# Patient Record
Sex: Female | Born: 1950
Health system: Southern US, Community
[De-identification: ages and names within clinical notes are randomized; demographics above are authoritative.]

## PROBLEM LIST (undated history)

## (undated) ENCOUNTER — Emergency Department (HOSPITAL_COMMUNITY): Payer: Medicare PPO

## (undated) DIAGNOSIS — D259 Leiomyoma of uterus, unspecified: Secondary | ICD-10-CM

## (undated) DIAGNOSIS — F329 Major depressive disorder, single episode, unspecified: Secondary | ICD-10-CM

## (undated) DIAGNOSIS — J989 Respiratory disorder, unspecified: Secondary | ICD-10-CM

## (undated) DIAGNOSIS — F32A Depression, unspecified: Secondary | ICD-10-CM

## (undated) DIAGNOSIS — M069 Rheumatoid arthritis, unspecified: Secondary | ICD-10-CM

## (undated) DIAGNOSIS — N840 Polyp of corpus uteri: Secondary | ICD-10-CM

## (undated) DIAGNOSIS — C50919 Malignant neoplasm of unspecified site of unspecified female breast: Secondary | ICD-10-CM

## (undated) DIAGNOSIS — N950 Postmenopausal bleeding: Secondary | ICD-10-CM

## (undated) DIAGNOSIS — D649 Anemia, unspecified: Secondary | ICD-10-CM

## (undated) DIAGNOSIS — K219 Gastro-esophageal reflux disease without esophagitis: Secondary | ICD-10-CM

## (undated) DIAGNOSIS — F419 Anxiety disorder, unspecified: Secondary | ICD-10-CM

## (undated) DIAGNOSIS — M62838 Other muscle spasm: Secondary | ICD-10-CM

## (undated) DIAGNOSIS — D869 Sarcoidosis, unspecified: Secondary | ICD-10-CM

## (undated) DIAGNOSIS — I10 Essential (primary) hypertension: Secondary | ICD-10-CM

## (undated) DIAGNOSIS — E119 Type 2 diabetes mellitus without complications: Secondary | ICD-10-CM

## (undated) HISTORY — DX: Major depressive disorder, single episode, unspecified: F32.9

## (undated) HISTORY — DX: Anxiety disorder, unspecified: F41.9

## (undated) HISTORY — DX: Depression, unspecified: F32.A

## (undated) HISTORY — DX: Gastro-esophageal reflux disease without esophagitis: K21.9

## (undated) HISTORY — PX: FOOT SURGERY: SHX648

## (undated) HISTORY — PX: LYMPH NODE BIOPSY: SHX201

## (undated) HISTORY — PX: COLONOSCOPY: SHX174

## (undated) HISTORY — PX: MOUTH SURGERY: SHX715

---

## 2000-04-07 ENCOUNTER — Other Ambulatory Visit: Admission: RE | Admit: 2000-04-07 | Discharge: 2000-04-07 | Payer: Self-pay | Admitting: Gynecology

## 2001-05-25 ENCOUNTER — Other Ambulatory Visit: Admission: RE | Admit: 2001-05-25 | Discharge: 2001-05-25 | Payer: Self-pay | Admitting: *Deleted

## 2001-06-01 ENCOUNTER — Encounter: Payer: Self-pay | Admitting: *Deleted

## 2001-06-01 ENCOUNTER — Encounter: Admission: RE | Admit: 2001-06-01 | Discharge: 2001-06-01 | Payer: Self-pay | Admitting: *Deleted

## 2001-07-04 ENCOUNTER — Other Ambulatory Visit: Admission: RE | Admit: 2001-07-04 | Discharge: 2001-07-04 | Payer: Self-pay | Admitting: *Deleted

## 2003-10-08 ENCOUNTER — Inpatient Hospital Stay (HOSPITAL_COMMUNITY): Admission: AD | Admit: 2003-10-08 | Discharge: 2003-10-10 | Payer: Self-pay | Admitting: Internal Medicine

## 2004-10-21 ENCOUNTER — Ambulatory Visit: Payer: Self-pay | Admitting: Critical Care Medicine

## 2004-10-22 ENCOUNTER — Ambulatory Visit: Payer: Self-pay | Admitting: Internal Medicine

## 2004-10-29 ENCOUNTER — Ambulatory Visit: Admission: RE | Admit: 2004-10-29 | Discharge: 2004-10-29 | Payer: Self-pay | Admitting: Critical Care Medicine

## 2004-11-18 ENCOUNTER — Ambulatory Visit: Payer: Self-pay | Admitting: Critical Care Medicine

## 2005-01-09 ENCOUNTER — Ambulatory Visit: Payer: Self-pay | Admitting: Internal Medicine

## 2005-01-23 ENCOUNTER — Ambulatory Visit: Payer: Self-pay | Admitting: Critical Care Medicine

## 2005-04-15 ENCOUNTER — Encounter: Admission: RE | Admit: 2005-04-15 | Discharge: 2005-04-15 | Payer: Self-pay | Admitting: Allergy and Immunology

## 2005-05-14 ENCOUNTER — Ambulatory Visit: Payer: Self-pay | Admitting: Critical Care Medicine

## 2005-05-21 ENCOUNTER — Ambulatory Visit: Admission: RE | Admit: 2005-05-21 | Discharge: 2005-05-21 | Payer: Self-pay | Admitting: Critical Care Medicine

## 2005-05-21 ENCOUNTER — Ambulatory Visit: Payer: Self-pay | Admitting: Critical Care Medicine

## 2005-05-21 ENCOUNTER — Encounter (INDEPENDENT_AMBULATORY_CARE_PROVIDER_SITE_OTHER): Payer: Self-pay | Admitting: Specialist

## 2005-07-14 ENCOUNTER — Ambulatory Visit: Payer: Self-pay | Admitting: Adult Health

## 2007-02-14 ENCOUNTER — Ambulatory Visit (HOSPITAL_COMMUNITY): Admission: RE | Admit: 2007-02-14 | Discharge: 2007-02-14 | Payer: Self-pay | Admitting: Internal Medicine

## 2007-04-29 DIAGNOSIS — D869 Sarcoidosis, unspecified: Secondary | ICD-10-CM | POA: Insufficient documentation

## 2007-04-29 DIAGNOSIS — J209 Acute bronchitis, unspecified: Secondary | ICD-10-CM | POA: Insufficient documentation

## 2007-04-29 DIAGNOSIS — K219 Gastro-esophageal reflux disease without esophagitis: Secondary | ICD-10-CM | POA: Insufficient documentation

## 2007-04-29 DIAGNOSIS — J309 Allergic rhinitis, unspecified: Secondary | ICD-10-CM | POA: Insufficient documentation

## 2007-11-30 ENCOUNTER — Emergency Department (HOSPITAL_COMMUNITY): Admission: EM | Admit: 2007-11-30 | Discharge: 2007-12-01 | Payer: Self-pay | Admitting: Emergency Medicine

## 2010-07-05 ENCOUNTER — Emergency Department (HOSPITAL_COMMUNITY)
Admission: EM | Admit: 2010-07-05 | Discharge: 2010-07-05 | Payer: Self-pay | Source: Home / Self Care | Admitting: Emergency Medicine

## 2010-09-12 ENCOUNTER — Other Ambulatory Visit: Payer: Self-pay | Admitting: Orthopedic Surgery

## 2010-09-12 DIAGNOSIS — M545 Low back pain, unspecified: Secondary | ICD-10-CM

## 2010-09-17 ENCOUNTER — Ambulatory Visit
Admission: RE | Admit: 2010-09-17 | Discharge: 2010-09-17 | Disposition: A | Discharge status: Home / Self Care | Payer: BC Managed Care – PPO | Source: Ambulatory Visit | Attending: Orthopedic Surgery | Admitting: Orthopedic Surgery

## 2010-09-17 DIAGNOSIS — M545 Low back pain, unspecified: Secondary | ICD-10-CM
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
## 2010-11-28 NOTE — Op Note (Signed)
Kristin Arroyo, Kristin Arroyo                 ACCOUNT NO.:  [id number]   MEDICAL RECORD NO.:  [id number]          PATIENT TYPE:  AMB   LOCATION:  CARD                         FACILITY:  [facility]   PHYSICIAN:  Shan Levans, M.D. LHCDATE OF BIRTH:  December 31, 1950   DATE OF PROCEDURE:  05/21/2005  DATE OF DISCHARGE:                                 OPERATIVE REPORT   PROCEDURE:  Bronchoscopy.   INDICATION:  Bilateral interstitial honeycomb pattern on chest x-ray and CT  scan, evaluate for endobronchial sarcoidosis and evaluate for sarcoid  involvement of the lung with previous history of sarcoid involvement of the  liver and skin.   OPERATOR:  Shan Levans, MD   ANESTHESIA:  1% Xylocaine local.   PREOPERATIVE MEDICATIONS:  Demerol 50 mg, Versed 5 mg IV push.   DESCRIPTION OF PROCEDURE:  The Pentax video bronchoscope was introduced via  the right naris.  The upper airways were visualized and were unremarkable.  The entire tracheobronchial tree was visualized and revealed endobronchial  nodularity compatible with sarcoidosis.  Lesions were see that were  compatible with this.  There was stricturing in the right lower lobe  orifice.  Attention was paid to the right lower lobe.  Bronchial washings  were obtained.  Transbronchial biopsies were obtained as well.   COMPLICATION:  None.   IMPRESSION:  Endobronchial sarcoidosis.   RECOMMENDATIONS:  Follow up microbiologic and pathologic data.      Shan Levans, M.D. [facility]  Electronically Signed     PW/MEDQ  D:  05/21/2005  T:  05/21/2005  Job:  [id number]   cc:   Jessica Priest, M.D.  Fax: [phone]   Areatha Keas, M.D.  Fax: [phone]
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
## 2010-11-28 NOTE — H&P (Signed)
Kristin Arroyo, Kristin Arroyo                             ACCOUNT NO.:  [id number]   MEDICAL RECORD NO.:  [id number]                   PATIENT TYPE:  INP   LOCATION:  2002                                 FACILITY:  [facility]   PHYSICIAN:  Larina Earthly, M.D.                     DATE OF BIRTH:  [birth year]   DATE OF ADMISSION:  10/08/2003  DATE OF DISCHARGE:                                HISTORY & PHYSICAL   CHIEF COMPLAINT:  Substernal chest pain with nonproductive cough.   HISTORY OF PRESENT ILLNESS:  This is a 60 year old African-American female  who has a past medical history significant for chronic asthma, seasonal  allergic rhinitis, osteoarthritis with rheumatoid arthritis followed by Dr.  Phylliss Bob, hypertension since 1997, sarcoid diagnosed in the 1970s and  hyperlipidemia along with iron-deficiency anemia who presented to my office  as an outpatient with a 3- to 4-day history of sore throat, nonproductive  cough, myalgias, and substernal chest pain that worsened with movement  specifically moving down the hall or twisting in bed associated with minor  dyspnea on exertion.  She denied any nausea, vomiting, sweats, fevers, or  chills.  In our office upon presentation her oxygen saturation was 100% on  room air but she continued to have chest discomfort with walking down the  hall and mild shortness of breath with exertion.  Patient claims that her  symptoms were abating however, again found it very difficult to walk down  the hall and given her symptomatology and the need to evaluate this  substernal chest pain further she was subsequently admitted to the hospital  for further evaluation and management.  Dr. Delfin Edis was contacted and  he agreed to consult from a cardiology perspective.   REVIEW OF SYSTEMS:  Negative for fevers, sweats, weight loss, ENT  symptomatology with the exception of cough.  Positive for chest pain without  radiation located in the substernal area, chest pain was not  associated with  caloric or p.o. intake, there was no pleuritic component and there was no  chest wall tenderness.  Patient did admit to dyspnea on exertion along with  nonproductive cough.  Patient denied any diarrhea, constipation, or  abdominal pain along with nausea and vomiting.  Patient denied any new  musculoskeletal symptoms with the exception of some diffuse myalgias.  Patient denied any focal neurological deficits.   PROBLEM LIST:  1. Asthma, treated by Dr. Jethro Bolus.  2. Sarcoid dating back to the 1970s, again treated by Dr. Jethro Bolus.  3. Seasonal allergic rhinitis.  4. Osteoarthritis with history of rheumatoid arthritis with history of     prednisone use.  5. Hypertension dating back to 1997.  6. Hyperlipidemia.  7. History of iron-deficiency anemia.  8. Gastroesophageal reflux disease treated with proton pump inhibitor.  9. History of METHOTREXATE intolerance secondary to gastrointestinal     symptomatology.  10.      History of a normal bone density scan in December 2004.  11.      History of abnormal chest x-ray with chronic changes in the upper     lobes and a history of right middle and upper lobe infiltrate consistent     with pneumonia.  12.      History of consultation with Dr. Ritta Slot for iron-deficiency     anemia.   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   FAMILY HISTORY:  Family history significant for congestive heart failure,  asthma, hypertension, goiter, diabetes, arthritis, and peptic ulcer disease  with a questionable history of early heart disease in grandparents.   CURRENT MEDICATIONS:  1. Albuterol metered-dose inhaler.  2. Quinine p.r.n.  3. Iron p.r.n.  4. Naprosyn 500 mg p.o. b.i.d.  5. Advair 250/50 one puff b.i.d. (patient noncompliant with this on a     regular basis).  6.  Allegra-D p.r.n.  7. Nasonex daily use.  8. Benicar HCT 40/25 mg one p.o. once daily.  9. Singulair 10 mg each day (unclear compliance).  10.      Aciphex 20 mg p.o. once daily.  11.      Calcium with vitamin D supplementation b.i.d.  12.      Fosamax 70 mg one p.o. every week.   LABORATORY EVALUATION FROM JANUARY 2004:  Hemoglobin 9.7, hematocrit 32%,  platelet count 309, white blood cell count 6.9.  Comprehensive metabolic  panel, TSH, and lipid panel unremarkable with an LDL of 114, total  cholesterol 185, HDL cholesterol 63.  Hemoccult cards x3 normal December  2003.  EKG from today reveals normal sinus rhythm, normal axis with  nonspecific ST changes in multiple leads compared with January 2004.   PHYSICAL EXAMINATION:  GENERAL:  We have a well-developed African-American  female in no apparent distress with some audible upper airway wheezing but  talking in full sentences.  HEAD AND NECK:  Sclerae anicteric.  Extraocular movements are intact.  There  is no oropharyngeal lesions.  Tympanic membranes are clear bilaterally.  The  neck is supple.  There is no cervical lymphadenopathy.  There is no carotid  bruits appreciated.  LUNGS:  Clear to auscultation bilaterally.  CARDIOVASCULAR:  Regular rate and rhythm.  ABDOMEN:  Soft, nontender, nondistended abdomen.  Bowel sounds are present.  There is no hepatosplenomegaly.  EXTREMITIES:  No edema.  Pedal pulses are intact.  NEUROLOGICAL:  Exam is grossly nonfocal.  There is no cervical or axillary  lymphadenopathy.  There is no JVD.   ASSESSMENT AND PLAN:  1. Atypical chest pain in the setting of upper respiratory tract infection,     nonproductive cough, and asthma with a history of noncompliance with     medications on a regular basis.  We will admit the patient, rule out for     myocardial infarction, check lipid panel, electrolytes, CBC, EKG and    chest x-ray, provide aspirin on a daily basis and we will obtain     cardiology  consult by Dr. Delfin Edis.  Patient does not have any chest     pain at rest.  2. Upper respiratory tract infection and/or asthma flare.  We will provide     albuterol nebulizers to the lungs with Advair twice daily and  empiric     Avelox given question of her bronchitis along with mucolytic agents.                                                Larina Earthly, M.D.   RA/MEDQ  D:  10/08/2003  T:  10/08/2003  Job:  [id number]   cc:   Colleen Can. Deborah Chalk, M.D.  Fax: [phone]

## 2010-11-28 NOTE — Cardiovascular Report (Signed)
NAMEMERLE, WHITEHORN NO.:  [id number]   MEDICAL RECORD NO.:  [id number]                   PATIENT TYPE:  INP   LOCATION:  2002                                 FACILITY:  [facility]   PHYSICIAN:  Colleen Can. Deborah Chalk, M.D.            DATE OF BIRTH:  03/26/1951   DATE OF PROCEDURE:  10/09/2003  DATE OF DISCHARGE:                              CARDIAC CATHETERIZATION   PROCEDURE:  Left heart catheterization with selective coronary angiography,  left ventricular angiography.   TYPE AND SITE OF ENTRY:  Percutaneous right femoral artery with Angio-Seal.   CATHETERS:  A 6 French 4 curved Judkins right and left coronary catheters.  A 6 French pigtail ventriculographic catheter.   CONTRAST MATERIAL:  Omnipaque.   MEDICATIONS GIVEN PRIOR TO PROCEDURE:  Valium 10 mg p.o.   MEDICATIONS GIVEN DURING THE PROCEDURE:  Versed 3 mg IV.  Ancef 1 g IV.   COMMENTS:  The patient tolerated the procedure well.   HEMODYNAMIC DATA:  The aortic pressure was 126/84.  LV was 142/6-9.  There  was no aortic valve gradient noted on pullback.   ANGIOGRAPHIC DATA:  1. Left main coronary artery:  Left main coronary artery is normal.  2. Left anterior descending:  Left anterior descending is relatively short     and ends on the anterior wall towards the apex.  It is somewhat tortuous     but normal.  3. Intermediate coronary.  There is an intermediate coronary artery that is     tortuous.  It is normal.  4. Left circumflex.  Left circumflex is a moderate size vessel.  It is     normal.  5. Right coronary artery.  The right coronary artery is a large dominant     vessel with a large posterior descending vessel.  The posterior     descending vessel extends to and around the apex.  It is normal.   LEFT VENTRICULAR ANGIOGRAM:  Left ventricular angiogram was performed in the  RAO position.  Overall cardiac size and silhouette are normal.  Global  ejection fraction is 55-60%.  Regional  wall motion is normal.   OVERALL IMPRESSION:  1. Normal coronary arteries.  2. Normal left ventricular function.                                               Colleen Can. Deborah Chalk, M.D.    SNT/MEDQ  D:  10/09/2003  T:  10/10/2003  Job:  [id number]   cc:   Larina Earthly, M.D.  [address]  [place]  [place] [address]  Fax: [phone]

## 2014-07-28 ENCOUNTER — Emergency Department (HOSPITAL_COMMUNITY): Payer: BC Managed Care – PPO

## 2014-07-28 ENCOUNTER — Encounter (HOSPITAL_COMMUNITY): Payer: Self-pay | Admitting: Emergency Medicine

## 2014-07-28 ENCOUNTER — Emergency Department (HOSPITAL_COMMUNITY)
Admission: EM | Admit: 2014-07-28 | Discharge: 2014-07-28 | Disposition: A | Discharge status: Home / Self Care | Payer: BC Managed Care – PPO | Attending: Emergency Medicine | Admitting: Emergency Medicine

## 2014-07-28 DIAGNOSIS — Y998 Other external cause status: Secondary | ICD-10-CM | POA: Diagnosis not present

## 2014-07-28 DIAGNOSIS — Y9289 Other specified places as the place of occurrence of the external cause: Secondary | ICD-10-CM | POA: Insufficient documentation

## 2014-07-28 DIAGNOSIS — Z23 Encounter for immunization: Secondary | ICD-10-CM | POA: Insufficient documentation

## 2014-07-28 DIAGNOSIS — E119 Type 2 diabetes mellitus without complications: Secondary | ICD-10-CM | POA: Insufficient documentation

## 2014-07-28 DIAGNOSIS — Z8709 Personal history of other diseases of the respiratory system: Secondary | ICD-10-CM | POA: Insufficient documentation

## 2014-07-28 DIAGNOSIS — S90111A Contusion of right great toe without damage to nail, initial encounter: Secondary | ICD-10-CM | POA: Diagnosis not present

## 2014-07-28 DIAGNOSIS — Z79899 Other long term (current) drug therapy: Secondary | ICD-10-CM | POA: Diagnosis not present

## 2014-07-28 DIAGNOSIS — Z8739 Personal history of other diseases of the musculoskeletal system and connective tissue: Secondary | ICD-10-CM | POA: Diagnosis not present

## 2014-07-28 DIAGNOSIS — W1839XA Other fall on same level, initial encounter: Secondary | ICD-10-CM | POA: Insufficient documentation

## 2014-07-28 DIAGNOSIS — S99921A Unspecified injury of right foot, initial encounter: Secondary | ICD-10-CM

## 2014-07-28 DIAGNOSIS — Z862 Personal history of diseases of the blood and blood-forming organs and certain disorders involving the immune mechanism: Secondary | ICD-10-CM | POA: Diagnosis not present

## 2014-07-28 DIAGNOSIS — I10 Essential (primary) hypertension: Secondary | ICD-10-CM | POA: Insufficient documentation

## 2014-07-28 DIAGNOSIS — S3992XA Unspecified injury of lower back, initial encounter: Secondary | ICD-10-CM | POA: Diagnosis present

## 2014-07-28 DIAGNOSIS — Y9389 Activity, other specified: Secondary | ICD-10-CM | POA: Diagnosis not present

## 2014-07-28 DIAGNOSIS — S20229A Contusion of unspecified back wall of thorax, initial encounter: Secondary | ICD-10-CM | POA: Insufficient documentation

## 2014-07-28 DIAGNOSIS — S24109A Unspecified injury at unspecified level of thoracic spinal cord, initial encounter: Secondary | ICD-10-CM | POA: Diagnosis not present

## 2014-07-28 DIAGNOSIS — W19XXXA Unspecified fall, initial encounter: Secondary | ICD-10-CM

## 2014-07-28 HISTORY — DX: Type 2 diabetes mellitus without complications: E11.9

## 2014-07-28 HISTORY — DX: Sarcoidosis, unspecified: D86.9

## 2014-07-28 HISTORY — DX: Essential (primary) hypertension: I10

## 2014-07-28 HISTORY — DX: Respiratory disorder, unspecified: J98.9

## 2014-07-28 MED ORDER — TETANUS-DIPHTH-ACELL PERTUSSIS 5-2.5-18.5 LF-MCG/0.5 IM SUSP
0.5000 mL | Freq: Once | INTRAMUSCULAR | Status: AC
  Administered 2014-07-28: 0.5 mL via INTRAMUSCULAR
  Filled 2014-07-28: qty 0.5

## 2014-07-28 MED ORDER — CYCLOBENZAPRINE HCL 5 MG PO TABS: 5.0000 mg | ORAL_TABLET | Freq: Three times a day (TID) | ORAL | Status: AC | PRN

## 2014-07-28 NOTE — ED Provider Notes (Signed)
CSN: [id number]     Arrival date & time 07/28/14  1133 History  This chart was scribed for non-physician practitioner working with Dorie Rank, MD by Mercy Moore, ED Scribe. This patient was seen in room [place] and the patient's care was started at 12:32 PM.   Chief Complaint  Patient presents with  . Fall   The history is provided by the patient. No language interpreter was used.   HPI Comments: Kristin Arroyo is a 64 y.o. female with PMHx of Hypertension, Diabetes Mellitus, arthritis, who presents to the Emergency Department after a fall at a local nail salon today. Patient reports that while at the nail salon her technician "went too deep" on her right great toenail, causing it to bleed. Patient reports that as she was leaving, she stopped to talk to the manager about her complaints. In an attempt to inspect her foot, the manager grabbed her pant leg to get a closer look causing the patient lose her balance and fall backwards. Patient reports that she struck her head on nearby table/chairs. Patient denies loss of consciousness. Patient is now complaining of "lowgrade pain" at site of impact in her occipital region and mild lower back pain. Patient reports alleviation of her back pain at rest. Patient reports that she is ambulatory. Patient reports history of herniated disc, treated at [facility].   Past Medical History  Diagnosis Date  . Hypertension   . Diabetes mellitus without complication   . Arthritis   . Respiratory disorder   . Sarcoid    Past Surgical History  Procedure Laterality Date  . Cesarean section    . Mouth surgery     Family History  Problem Relation Age of Onset  . Diabetes Mother   . Hypertension Mother   . Diabetes Father   . Hypertension Father    History  Substance Use Topics  . Smoking status: Never Smoker   . Smokeless tobacco: Not on file  . Alcohol Use: Yes     Comment: occ   OB History    No data available     Review of Systems   Constitutional: Negative for fever and chills.  Musculoskeletal: Positive for back pain.  Neurological: Negative for weakness.    Allergies  Zithromax  Home Medications   Prior to Admission medications   Medication Sig Start Date End Date Taking? Authorizing Provider  methotrexate (RHEUMATREX) 5 MG tablet Take 5 mg by mouth once a week. Caution: Chemotherapy. Protect from light.   Yes Historical Provider, MD  ADVAIR DISKUS 500-50 MCG/DOSE AEPB  06/15/14   Historical Provider, MD  alendronate (FOSAMAX) 70 MG tablet  07/07/14   Historical Provider, MD  atorvastatin (LIPITOR) 40 MG tablet Take 40 mg by mouth daily. 05/12/14   Historical Provider, MD  cyclobenzaprine (FLEXERIL) 10 MG tablet  05/14/14   Historical Provider, MD  irbesartan (AVAPRO) 300 MG tablet Take 300 mg by mouth daily. 05/30/14   Historical Provider, MD   Triage Vitals: BP 154/74 mmHg  Pulse 66  Temp(Src) 98.1 F (36.7 C) (Oral)  Resp 20  Wt 220 lb (99.791 kg)  SpO2 99% Physical Exam  Constitutional: She is oriented to person, place, and time. She appears well-developed and well-nourished. No distress.  HENT:  Head: Normocephalic and atraumatic.  Eyes: EOM are normal.  Neck: Neck supple. No tracheal deviation present.  Cardiovascular: Normal rate.   Pulmonary/Chest: Effort normal. No respiratory distress.  Musculoskeletal: Normal range of motion. She exhibits tenderness.  Right foot: There is normal range of motion.       Feet:  Pt has equal strength to bilateral lower extremities.  Neurosensory function adequate to both legs No clonus on dorsiflextion Skin color is normal. Skin is warm and moist.  I see no step off deformity, no midline bony tenderness.  Pt is able to ambulate.  No crepitus, laceration, effusion, induration, lesions, swelling.   Pedal pulses are symmetrical and palpable bilaterally  mild tenderness to palpation of lumbar and thoracic paraspinel muscles   Neurological: She is alert  and oriented to person, place, and time.  Skin: Skin is warm and dry.  Psychiatric: She has a normal mood and affect. Her behavior is normal.  Nursing note and vitals reviewed.   ED Course  Procedures (including critical care time)  COORDINATION OF CARE: 12:45 PM- Plans to obtain imaging of thoracic and lumbar spine. Discussed treatment plan with patient at bedside and patient agreed to plan.   Labs Review Labs Reviewed - No data to display  Imaging Review Dg Thoracic Spine 2 View  07/28/2014   CLINICAL DATA:  Pt was at nail salon today and had a fall. Pt states the man who did her pedicure asked to see her toes and picked her foot/leg up off of the floor while she was standing and she told him that wasn't a good idea and he raised her leg up higher and she fell backwards. Pt c/o lower to mid back pain from center towards left side. Hx herniated disc per pt.  EXAM: THORACIC SPINE - 2 VIEW  COMPARISON:  Chest radiographs dated 05/21/2005 and 04/15/2005. Chest CT dated 10/22/2004.  FINDINGS: Biapical lung honeycombing is unchanged. Mild thoracic spine degenerative changes. Cervical spine degenerative changes. No fractures or subluxations.  IMPRESSION: No fracture or subluxation.  Degenerative changes.   Electronically Signed   By: Enrique Sack M.D.   On: 07/28/2014 13:51   Dg Lumbar Spine Complete  07/28/2014   CLINICAL DATA:  Pt was at nail salon today and had a fall. Pt states the man who did her pedicure asked to see her toes and picked her foot/leg up off of the floor while she was standing and she told him that wasn't a good idea and he raised her leg up higher and she fell backwards. Pt c/o lower to mid back pain from center towards left side. Hx herniated disc per pt.  EXAM: LUMBAR SPINE - COMPLETE 4+ VIEW  COMPARISON:  Thoracic spine radiographs obtained at the same time today. Lumbar spine MR dated 09/17/2010.  FINDINGS: Five non-rib-bearing lumbar vertebrae. Mild levoconvex rotary  scoliosis. Multiple calcified masses in the pelvis and protruding into the lower abdomen. Corresponding enlarged uterus containing fibroids on the previous MR. Mild anterior spur formation at multiple levels. Facet degenerative changes in the mid and lower lumbar spine. No fractures, pars defects or subluxations.  IMPRESSION: 1. No fracture or subluxation. 2. Degenerative changes. 3. Enlarged uterus containing multiple calcified fibroids.   Electronically Signed   By: Enrique Sack M.D.   On: 07/28/2014 13:54     EKG Interpretation None      MDM   Final diagnoses:  Fall  Back contusion, unspecified laterality, initial encounter  Toe injury, right, initial encounter   Tetanus given in ED.  64 y.o.Kristin Arroyo's  with back pain. No neurological deficits and normal neuro exam. Patient can walk. No loss of bowel or bladder control. No concern for cauda equina at this time  base on HPI and physical exam findings. No fever, night sweats, weight loss, h/o cancer, IVDU.   RICE protocol and pain medicine indicated and discussed with patient.   Patient Plan 1. Medications: pain medication and muscle relaxer. Cont usual home medications unless otherwise directed. 2. Treatment: rest, drink plenty of fluids, gentle stretching as discussed, alternate ice and heat  3. Follow Up: Please followup with your primary doctor for discussion of your diagnoses and further evaluation after today's visit; if you do not have a primary care doctor use the resource guide provided to find one  Advised to follow-up with the orthopedist if symptoms do not start to resolve in the next 2-3 days. If develop loss of bowel or urinary control return to the ED as soon as possible for further evaluation. To take the medications as prescribed as they can cause harm if not taken appropriately.   Vital signs are stable at discharge. Filed Vitals:   07/28/14 1145  BP: 154/74  Pulse: 66  Temp: 98.1 F (36.7 C)  Resp: 20     Patient/guardian has voiced understanding and agreed to follow-up with the PCP or specialist.      I personally performed the services described in this documentation, which was scribed in my presence. The recorded information has been reviewed and is accurate.    Linus Mako, PA-C 07/28/14 1405  Dorie Rank, MD 07/29/14 0730

## 2014-07-28 NOTE — Discharge Instructions (Signed)
Toe Injuries and Amputations You have cut off (amputated) part of your toe. Your outcome depends largely on how much was amputated. If just the tip is amputated, often the end of the toe will grow back and the toe may return much to the same as it was before the injury. If more of the toe is missing, your caregiver has done the best with the tissue remaining to allow you to keep as much toe as is possible or has finished the amputation at a level that will leave you with the most functional toe. This means a toe that will work the best for you. Please read the instructions outlined below and refer to this sheet in the next few weeks. These instructions provide you with general information on caring for yourself. Your caregiver may also give you specific instructions. While your treatment has been done according to the most current medical practices available, unavoidable complications occasionally occur. If you have any problems or questions after discharge, call your caregiver. HOME CARE INSTRUCTIONS   You may resume a normal diet and activities as directed or allowed.  Keep your foot elevated when possible. This helps decrease pain and swelling.  Keep ice packs (a bag of ice wrapped in a towel) on the injured area for 15-20 minutes, 03-04 times per day, for the first two days. Use ice only if OK with your caregiver.  Change dressings if necessary or as directed.  Clean the wounded area as directed.  Only take over-the-counter or prescription medicines for pain, discomfort, or fever as directed by your caregiver.  Keep appointments as directed. SEEK IMMEDIATE MEDICAL CARE IF:  There is redness, swelling, numbness or increasing pain in the wound.  There is pus coming from wound.  You have an unexplained oral temperature above 102 F (38.9 C) or as your caregiver suggests.  There is a bad (foul) smell coming from the wound or dressing.  The edges of the wound break open (the edges are not  staying together) after sutures or staples have been removed. Document Released: 05/20/2005 Document Revised: 09/21/2011 Document Reviewed: 10/17/2008 [facility] Patient Information 2015 [place], [place]. This information is not intended to replace advice given to you by your health care provider. Make sure you discuss any questions you have with your health care provider.  Contusion A contusion is a deep bruise. Contusions are the result of an injury that caused bleeding under the skin. The contusion may turn blue, purple, or yellow. Minor injuries will give you a painless contusion, but more severe contusions may stay painful and swollen for a few weeks.  CAUSES  A contusion is usually caused by a blow, trauma, or direct force to an area of the body. SYMPTOMS   Swelling and redness of the injured area.  Bruising of the injured area.  Tenderness and soreness of the injured area.  Pain. DIAGNOSIS  The diagnosis can be made by taking a history and physical exam. An X-ray, CT scan, or MRI may be needed to determine if there were any associated injuries, such as fractures. TREATMENT  Specific treatment will depend on what area of the body was injured. In general, the best treatment for a contusion is resting, icing, elevating, and applying cold compresses to the injured area. Over-the-counter medicines may also be recommended for pain control. Ask your caregiver what the best treatment is for your contusion. HOME CARE INSTRUCTIONS   Put ice on the injured area.  Put ice in a plastic bag.  Place a towel between your skin and the bag.  Leave the ice on for 15-20 minutes, 3-4 times a day, or as directed by your health care provider.  Only take over-the-counter or prescription medicines for pain, discomfort, or fever as directed by your caregiver. Your caregiver may recommend avoiding anti-inflammatory medicines (aspirin, ibuprofen, and naproxen) for 48 hours because these medicines may increase  bruising.  Rest the injured area.  If possible, elevate the injured area to reduce swelling. SEEK IMMEDIATE MEDICAL CARE IF:   You have increased bruising or swelling.  You have pain that is getting worse.  Your swelling or pain is not relieved with medicines. MAKE SURE YOU:   Understand these instructions.  Will watch your condition.  Will get help right away if you are not doing well or get worse. Document Released: 04/08/2005 Document Revised: 07/04/2013 Document Reviewed: 05/04/2011 [name] Patient Information 2015 [place], [place]. This information is not intended to replace advice given to you by your health care provider. Make sure you discuss any questions you have with your health care provider. Back Pain, Adult Low back pain is very common. About 1 in 5 people have back pain.The cause of low back pain is rarely dangerous. The pain often gets better over time.About half of people with a sudden onset of back pain feel better in just 2 weeks. About 8 in 10 people feel better by 6 weeks.  CAUSES Some common causes of back pain include:  Strain of the muscles or ligaments supporting the spine.  Wear and tear (degeneration) of the spinal discs.  Arthritis.  Direct injury to the back. DIAGNOSIS Most of the time, the direct cause of low back pain is not known.However, back pain can be treated effectively even when the exact cause of the pain is unknown.Answering your caregiver's questions about your overall health and symptoms is one of the most accurate ways to make sure the cause of your pain is not dangerous. If your caregiver needs more information, he or she may order lab work or imaging tests (X-rays or MRIs).However, even if imaging tests show changes in your back, this usually does not require surgery. HOME CARE INSTRUCTIONS For many people, back pain returns.Since low back pain is rarely dangerous, it is often a condition that people can learn to [facility] their  own.   Remain active. It is stressful on the back to sit or stand in one place. Do not sit, drive, or stand in one place for more than 30 minutes at a time. Take short walks on level surfaces as soon as pain allows.Try to increase the length of time you walk each day.  Do not stay in bed.Resting more than 1 or 2 days can delay your recovery.  Do not avoid exercise or work.Your body is made to move.It is not dangerous to be active, even though your back may hurt.Your back will likely heal faster if you return to being active before your pain is gone.  Pay attention to your body when you bend and lift. Many people have less discomfortwhen lifting if they bend their knees, keep the load close to their bodies,and avoid twisting. Often, the most comfortable positions are those that put less stress on your recovering back.  Find a comfortable position to sleep. Use a firm mattress and lie on your side with your knees slightly bent. If you lie on your back, put a pillow under your knees.  Only take over-the-counter or prescription medicines as directed by  your caregiver. Over-the-counter medicines to reduce pain and inflammation are often the most helpful.Your caregiver may prescribe muscle relaxant drugs.These medicines help dull your pain so you can more quickly return to your normal activities and healthy exercise.  Put ice on the injured area.  Put ice in a plastic bag.  Place a towel between your skin and the bag.  Leave the ice on for 15-20 minutes, 03-04 times a day for the first 2 to 3 days. After that, ice and heat may be alternated to reduce pain and spasms.  Ask your caregiver about trying back exercises and gentle massage. This may be of some benefit.  Avoid feeling anxious or stressed.Stress increases muscle tension and can worsen back pain.It is important to recognize when you are anxious or stressed and learn ways to manage it.Exercise is a great option. SEEK MEDICAL  CARE IF:  You have pain that is not relieved with rest or medicine.  You have pain that does not improve in 1 week.  You have new symptoms.  You are generally not feeling well. SEEK IMMEDIATE MEDICAL CARE IF:   You have pain that radiates from your back into your legs.  You develop new bowel or bladder control problems.  You have unusual weakness or numbness in your arms or legs.  You develop nausea or vomiting.  You develop abdominal pain.  You feel faint. Document Released: 06/29/2005 Document Revised: 12/29/2011 Document Reviewed: 10/31/2013 [facility] Patient Information 2015 [place], [place]. This information is not intended to replace advice given to you by your health care provider. Make sure you discuss any questions you have with your health care provider.

## 2014-07-28 NOTE — ED Notes (Signed)
She states she was at a local nail salon a worker there "went too deep" on her right great toenail, causing it to bleed.  She then summoned a Freight forwarder, who upon lifting her foot to inspect it caused her to topple backward.  She c/o some pain "in the middle of my back".  She ambulates capably and is in no distress.

## 2016-05-20 ENCOUNTER — Other Ambulatory Visit: Payer: Self-pay | Admitting: Obstetrics & Gynecology

## 2016-05-20 DIAGNOSIS — R928 Other abnormal and inconclusive findings on diagnostic imaging of breast: Secondary | ICD-10-CM

## 2016-06-17 ENCOUNTER — Other Ambulatory Visit: Payer: Self-pay | Admitting: Obstetrics & Gynecology

## 2016-06-17 ENCOUNTER — Ambulatory Visit
Admission: RE | Admit: 2016-06-17 | Discharge: 2016-06-17 | Disposition: A | Discharge status: Home / Self Care | Payer: BC Managed Care – PPO | Source: Ambulatory Visit | Attending: Obstetrics & Gynecology | Admitting: Obstetrics & Gynecology

## 2016-06-17 DIAGNOSIS — R921 Mammographic calcification found on diagnostic imaging of breast: Secondary | ICD-10-CM

## 2016-06-17 DIAGNOSIS — R928 Other abnormal and inconclusive findings on diagnostic imaging of breast: Secondary | ICD-10-CM

## 2016-06-23 ENCOUNTER — Ambulatory Visit
Admission: RE | Admit: 2016-06-23 | Discharge: 2016-06-23 | Disposition: A | Discharge status: Home / Self Care | Payer: BC Managed Care – PPO | Source: Ambulatory Visit | Attending: Obstetrics & Gynecology | Admitting: Obstetrics & Gynecology

## 2016-06-23 DIAGNOSIS — R921 Mammographic calcification found on diagnostic imaging of breast: Secondary | ICD-10-CM

## 2016-06-25 ENCOUNTER — Telehealth: Payer: Self-pay | Admitting: *Deleted

## 2016-06-25 DIAGNOSIS — C50412 Malignant neoplasm of upper-outer quadrant of left female breast: Secondary | ICD-10-CM | POA: Insufficient documentation

## 2016-06-25 NOTE — Telephone Encounter (Signed)
Received call back from patient.  Confirmed [facility] for 07/01/16 at 815am .  Instructions and contact information given.

## 2016-06-25 NOTE — Telephone Encounter (Signed)
Left message for a return phone call to schedule for [facility] 07/01/16.

## 2016-07-01 ENCOUNTER — Other Ambulatory Visit (HOSPITAL_BASED_OUTPATIENT_CLINIC_OR_DEPARTMENT_OTHER): Payer: BC Managed Care – PPO

## 2016-07-01 ENCOUNTER — Ambulatory Visit
Admission: RE | Admit: 2016-07-01 | Discharge: 2016-07-01 | Disposition: A | Discharge status: Home / Self Care | Payer: BC Managed Care – PPO | Source: Ambulatory Visit | Attending: Radiation Oncology | Admitting: Radiation Oncology

## 2016-07-01 ENCOUNTER — Encounter: Payer: Self-pay | Admitting: *Deleted

## 2016-07-01 ENCOUNTER — Ambulatory Visit: Payer: BC Managed Care – PPO | Attending: General Surgery | Admitting: Physical Therapy

## 2016-07-01 ENCOUNTER — Ambulatory Visit (HOSPITAL_BASED_OUTPATIENT_CLINIC_OR_DEPARTMENT_OTHER): Payer: BC Managed Care – PPO | Admitting: Oncology

## 2016-07-01 ENCOUNTER — Encounter: Payer: Self-pay | Admitting: Genetic Counselor

## 2016-07-01 VITALS — BP 180/81 | HR 79 | Temp 98.2°F | Resp 20 | Ht 64.0 in | Wt 202.0 lb

## 2016-07-01 DIAGNOSIS — Z170 Estrogen receptor positive status [ER+]: Principal | ICD-10-CM

## 2016-07-01 DIAGNOSIS — D0512 Intraductal carcinoma in situ of left breast: Secondary | ICD-10-CM | POA: Diagnosis not present

## 2016-07-01 DIAGNOSIS — C50412 Malignant neoplasm of upper-outer quadrant of left female breast: Secondary | ICD-10-CM

## 2016-07-01 LAB — CBC WITH DIFFERENTIAL/PLATELET
BASO%: 0.4 % (ref 0.0–2.0)
Basophils Absolute: 0 10e3/uL (ref 0.0–0.1)
EOS%: 6.8 % (ref 0.0–7.0)
Eosinophils Absolute: 0.3 10e3/uL (ref 0.0–0.5)
HCT: 40.8 % (ref 34.8–46.6)
HGB: 12.8 g/dL (ref 11.6–15.9)
LYMPH%: 29.6 % (ref 14.0–49.7)
MCH: 24.1 pg — ABNORMAL LOW (ref 25.1–34.0)
MCHC: 31.4 g/dL — ABNORMAL LOW (ref 31.5–36.0)
MCV: 76.8 fL — ABNORMAL LOW (ref 79.5–101.0)
MONO#: 0.3 10e3/uL (ref 0.1–0.9)
MONO%: 7.5 % (ref 0.0–14.0)
NEUT#: 2.5 10e3/uL (ref 1.5–6.5)
NEUT%: 55.7 % (ref 38.4–76.8)
Platelets: 177 10e3/uL (ref 145–400)
RBC: 5.31 10e6/uL (ref 3.70–5.45)
RDW: 15.4 % — ABNORMAL HIGH (ref 11.2–14.5)
WBC: 4.5 10e3/uL (ref 3.9–10.3)
lymph#: 1.3 10e3/uL (ref 0.9–3.3)

## 2016-07-01 LAB — COMPREHENSIVE METABOLIC PANEL
ALBUMIN: 3.4 g/dL — AB (ref 3.5–5.0)
ALK PHOS: 77 U/L (ref 40–150)
ALT: 17 U/L (ref 0–55)
ANION GAP: 6 meq/L (ref 3–11)
AST: 17 U/L (ref 5–34)
BILIRUBIN TOTAL: 0.54 mg/dL (ref 0.20–1.20)
BUN: 7.5 mg/dL (ref 7.0–26.0)
CO2: 29 meq/L (ref 22–29)
CREATININE: 0.7 mg/dL (ref 0.6–1.1)
Calcium: 9.1 mg/dL (ref 8.4–10.4)
Chloride: 108 mEq/L (ref 98–109)
EGFR: >90 mL/min/{1.73_m2} (ref >90)
Glucose: 124 mg/dl (ref 70–140)
Potassium: 3.7 mEq/L (ref 3.5–5.1)
Sodium: 143 mEq/L (ref 136–145)
TOTAL PROTEIN: 7.1 g/dL (ref 6.4–8.3)

## 2016-07-01 NOTE — Progress Notes (Signed)
Kristin Arroyo  [phone] [phone]     ID: Kristin Arroyo DOB: [birth year]  MR#: [id number]  [id number]  Patient Care Team: Prince Solian, MD as PCP - General (Internal Medicine) Excell Seltzer, MD as Consulting Physician (General Surgery) Chauncey Cruel, MD as Consulting Physician (Oncology) Gery Pray, MD as Consulting Physician (Radiation Oncology) Azucena Fallen, MD as Consulting Physician (Obstetrics and Gynecology) Valinda Party, MD (Rheumatology) Chauncey Cruel, MD OTHER MD:  CHIEF COMPLAINT: Ductal carcinoma in situ  CURRENT TREATMENT: Considering COMET trial   BREAST CANCER HISTORY: The patient had routine screening mammography November 2017 showing an area of microcalcifications in the left breast. She was referred for diagnostic mammography at the [facility] 06/17/2016 and this showed the breast density to be category B. In the upper-outer quadrant of the left breast there was an area of heterogeneous microcalcifications measuring 0.4 cm.  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  Her subsequent history is as detailed below.  INTERVAL HISTORY: Kristin Arroyo was evaluated in the multidisciplinary breast cancer clinic 07/01/2016. Her case was also presented in the multidisciplinary breast cancer conference that same morning. At that time a preliminary plan was proposed: Consideration of the COMET trial, otherwise lumpectomy radiation and consideration of anti-estrogens  REVIEW OF SYSTEMS: There were no specific symptoms leading to the original mammogram, which was routinely scheduled. The patient denies unusual headaches, visual changes, nausea, vomiting, stiff neck, dizziness, or gait imbalance. There has been no  cough, phlegm production, or pleurisy, no chest pain or pressure, and no change in bowel or bladder habits. The patient denies fever, rash, bleeding, unexplained fatigue or unexplained weight loss. A detailed review of systems was otherwise entirely negative.   PAST MEDICAL HISTORY: Past Medical History:  Diagnosis Date  . Anxiety   . Arthritis   . Depression   . Diabetes mellitus without complication ([place])   . GERD (gastroesophageal reflux disease)   . Hypertension   . Respiratory disorder   . Sarcoid ([place])     PAST SURGICAL HISTORY: Past Surgical History:  Procedure Laterality Date  . CESAREAN SECTION    . MOUTH SURGERY      FAMILY HISTORY Family History  Problem Relation Age of Onset  . Diabetes Mother   . Hypertension Mother   . Diabetes Father   . Hypertension Father   Patient's father died in his 53s from heart disease. The patient's mother experienced sudden death at age 26. The patient had 2 brothers, 1 sister. There is a family history of melanoma.  GYNECOLOGIC HISTORY:  No LMP recorded. Patient is postmenopausal. Menarche age 38, first live birth age 58. The patient is GX P1. She went through menopause in her early 20s. She did not take hormone replacement. She used oral contraceptives for some time in the remote past without complications.  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 At home it's just her "and Jesus", with no pets. Her daughter Kristin Arroyo lives in [place] him a and works in [employer] setting. The patient has no grandchildren. She attends a [employer] church.     ADVANCED DIRECTIVES: Not in place   HEALTH MAINTENANCE: Social History  Substance Use Topics  . Smoking status: Former Smoker    Quit date: 07/01/1974  .  Smokeless tobacco: Not on file  . Alcohol use Yes     Comment: occ     Colonoscopy:2016/[facility]  PAP: 2017  Bone density: 05/14/2009 at [facility], T score 0.4 (normal)   Allergies  Allergen Reactions  . Zithromax [Azithromycin] Hives    Current Outpatient Prescriptions  Medication Sig Dispense Refill  . ADVAIR DISKUS 500-50 MCG/DOSE AEPB Inhale 1 puff into the lungs 2 (two) times daily.   0  . alendronate (FOSAMAX) 70 MG tablet Take 70 mg by mouth once a week. On Wednesdays  1  . atorvastatin (LIPITOR) 40 MG tablet Take 40 mg by mouth every morning.   1  . cyclobenzaprine (FLEXERIL) 5 MG tablet Take 1 tablet (5 mg total) by mouth 3 (three) times daily as needed. 20 tablet 0  . irbesartan (AVAPRO) 300 MG tablet Take 300 mg by mouth every morning.   1  . methotrexate (RHEUMATREX) 2.5 MG tablet Take 20 mg by mouth once a week. Takes on Thursdays Caution:Chemotherapy. Protect from light.     No current facility-administered medications for this visit.     OBJECTIVE: Middle-aged African-American woman who appears well Vitals:   07/01/16 0902  BP: (!) 180/81  Pulse: 79  Resp: 20  Temp: 98.2 F (36.8 C)     Body mass index is 34.67 kg/m.    ECOG FS:0 - Asymptomatic  Ocular: Sclerae unicteric, pupils equal, round and reactive to light Ear-nose-throat: Oropharynx clear and moist Lymphatic: No cervical or supraclavicular adenopathy Lungs no rales or rhonchi, good excursion bilaterally Heart regular rate and rhythm, no murmur appreciated Abd soft, nontender, positive bowel sounds MSK no focal spinal tenderness, no joint edema Neuro: non-focal, well-oriented, appropriate affect Breasts: The right breast is benign. The left breast is status post recent biopsy. There is no palpable mass, ecchymosis, and no skin or nipple changes of concern. The left axilla is benign.   LAB RESULTS:  CMP     Component Value Date/Time   NA 143 07/01/2016 0847   K 3.7 07/01/2016 0847   CO2 29 07/01/2016 0847   GLUCOSE 124 07/01/2016 0847   BUN 7.5 07/01/2016 0847   CREATININE 0.7 07/01/2016 0847   CALCIUM 9.1 07/01/2016 0847    PROT 7.1 07/01/2016 0847   ALBUMIN 3.4 (L) 07/01/2016 0847   AST 17 07/01/2016 0847   ALT 17 07/01/2016 0847   ALKPHOS 77 07/01/2016 0847   BILITOT 0.54 07/01/2016 0847    INo results found for: SPEP, UPEP  Lab Results  Component Value Date   WBC 4.5 07/01/2016   NEUTROABS 2.5 07/01/2016   HGB 12.8 07/01/2016   HCT 40.8 07/01/2016   MCV 76.8 (L) 07/01/2016   PLT 177 07/01/2016      Chemistry      Component Value Date/Time   NA 143 07/01/2016 0847   K 3.7 07/01/2016 0847   CO2 29 07/01/2016 0847   BUN 7.5 07/01/2016 0847   CREATININE 0.7 07/01/2016 0847      Component Value Date/Time   CALCIUM 9.1 07/01/2016 0847   ALKPHOS 77 07/01/2016 0847   AST 17 07/01/2016 0847   ALT 17 07/01/2016 0847   BILITOT 0.54 07/01/2016 0847       No results found for: LABCA2  No components found for: LABCA125  No results for input(s): INR in the last 168 hours.  Urinalysis No results found for: COLORURINE, APPEARANCEUR, LABSPEC, PHURINE, GLUCOSEU, HGBUR, BILIRUBINUR, KETONESUR, PROTEINUR, UROBILINOGEN, NITRITE, LEUKOCYTESUR   STUDIES: Mm  Digital Diagnostic Unilat L  Result Date: 06/17/2016 CLINICAL DATA:  Small group of microcalcifications in the upper left breast, slightly laterally, on a recent screening mammogram. EXAM: DIGITAL DIAGNOSTIC LEFT MAMMOGRAM COMPARISON:  Previous exam(s). ACR Breast Density Category b: There are scattered areas of fibroglandular density. FINDINGS: Spot magnification views of the left breast demonstrate a 4 x 4 x 2 mm group of heterogeneous microcalcifications in the upper, slightly outer portion of the breast in the middle third. IMPRESSION: 4 mm group of indeterminate microcalcifications in the upper-outer quadrant of the left breast. RECOMMENDATION: Stereotactic guided core needle biopsy of the 4 mm group of indeterminate microcalcifications in the upper-outer quadrant of the left breast. This has been scheduled at 9:30 a.m. on 06/23/2016. I have  discussed the findings and recommendations with the patient. Results were also provided in writing at the conclusion of the visit. If applicable, a reminder letter will be sent to the patient regarding the next appointment. BI-RADS CATEGORY  4: Suspicious. Electronically Signed   By: Claudie Revering M.D.   On: 06/17/2016 09:46   Mm Clip Placement Left  Result Date: 06/23/2016 CLINICAL DATA:  Patient status post stereotactic guided core needle biopsy left breast calcifications. EXAM: DIAGNOSTIC LEFT MAMMOGRAM POST STEREOTACTIC BIOPSY COMPARISON:  Previous exam(s). FINDINGS: Mammographic images were obtained following stereotactic guided biopsy of left breast calcifications. X shaped marking clip in appropriate position. IMPRESSION: Appropriate position X shaped marking clip status post stereotactic guided biopsy left breast calcifications. Final Assessment: Post Procedure Mammograms for Marker Placement Electronically Signed   By: Lovey Newcomer M.D.   On: 06/23/2016 10:33   Mm Lt Breast Bx W Loc Dev 1st Lesion Image Bx Spec Stereo Guide  Addendum Date: 06/26/2016   ADDENDUM REPORT: 06/24/2016 15:25 ADDENDUM: Pathology revealed INTERMEDIATE GRADE DUCTAL CARCINOMA IN SITU WITH ASSOCIATED CALCIFICATIONS of the Left breast, upper outer. This was found to be concordant by Dr. Lovey Newcomer. Pathology results were discussed with the patient by telephone. The patient reported doing well after the biopsy with tenderness and minimal bleeding at the site. Post biopsy instructions and care were reviewed and questions were answered. The patient was encouraged to call [facility] for any additional concerns. The patient was referred to [facility] at [facility] on July 01, 2016. Pathology results reported by Terie Purser, RN on 06/24/2016. Electronically Signed   By: Lovey Newcomer M.D.   On: 06/24/2016 15:25   Result Date:  06/26/2016 CLINICAL DATA:  Patient with indeterminate left breast calcifications. EXAM: LEFT BREAST STEREOTACTIC CORE NEEDLE BIOPSY COMPARISON:  Previous exams. FINDINGS: The patient and I discussed the procedure of stereotactic-guided biopsy including benefits and alternatives. We discussed the high likelihood of a successful procedure. We discussed the risks of the procedure including infection, bleeding, tissue injury, clip migration, and inadequate sampling. Informed written consent was given. The usual time out protocol was performed immediately prior to the procedure. Using sterile technique and 1% Lidocaine as local anesthetic, under stereotactic guidance, a 9 gauge vacuum assisted core needle biopsy device was used to perform core needle biopsy of calcifications within the upper-outer left breast using a cranial approach. Specimen radiograph was performed showing calcifications. Specimens with calcifications are identified for pathology. At the conclusion of the procedure, a X shaped tissue marker clip was deployed into the biopsy cavity. Follow-up 2-view mammogram was performed and dictated separately. IMPRESSION: Stereotactic-guided biopsy of left breast calcifications. No apparent complications. Electronically Signed:  By: Lovey Newcomer M.D. On: 06/23/2016 10:17    ELIGIBLE FOR AVAILABLE RESEARCH PROTOCOL: COMET  ASSESSMENT: 65 y.o. [place] woman status post left breast upper outer quadrant biopsy 06/17/2016 for ductal carcinoma in situ, grade 2, estrogen and progesterone receptor positive  (1) considering COMET trial  (2) starting anastrozole upon enrollment  PLAN: We spent the better part of today's hour-long appointment discussing the biology of breast cancer in general, and the specifics of the patient's tumor in particular. Kristin Arroyo understands that in noninvasive ductal carcinoma, also called ductal carcinoma in situ ("DCIS") the breast cancer cells remain trapped in the ducts were they  started. They cannot travel to a vital organ. For that reason these cancers in themselves are not life-threatening.  If the whole breast is removed then all the ducts are removed and since the cancer cells are trapped in the ducts, the cure rate with mastectomy for noninvasive breast cancer is approximately 99%. Nevertheless the standard treatment is lumpectomy, because there is no survival advantage to mastectomy and because the cosmetic result is generally superior with breast conservation.  If the patient does undergo a lumpectomy, there will be some risk of recurrence. The recurrence can only be in the same breast since, again, the cells are trapped in the ducts. There is no connection from one breast to the other. The risk of local recurrence is cut by more than half with radiation, which is standard in this situation.  In estrogen receptor positive cancers like Kristin Arroyo, anti-estrogens can also be considered. They will further reduce the risk of recurrence by one half. In addition anti-estrogens will lower the risk of a new breast cancer developing in either breast, also by one half. That risk approaches 1% per year. Anti-estrogens reduce it to 1/2%.  After discussing the standard care of noninvasive breast cancer, we discussed the recent COMET trial. This likely will be a practice changing trial and it randomly assigned women like Kristin Arroyo to observation alone with or without anti-estrogens versus standard of care. Kristin Arroyo is very interested in participating and our research nurse is meeting with her to facilitate this.  Kristin Arroyo has a good understanding ofher options  She will call with any problems that may develop before her next visit here, which will be in approximately 1 month or earlier if necessary for study purposes.Marland Kitchen  Chauncey Cruel, MD   07/02/2016 7:56 AM Medical Oncology and Hematology [facility] [address] [place], [place] [address] Tel. [phone]    Fax.  [phone]

## 2016-07-01 NOTE — Progress Notes (Signed)
Nutrition Assessment  Reason for Assessment:  Pt seen in Breast Clinic  ASSESSMENT:   65 year old female with new diagnosis of left breast cancer.  Past medical history of GERD, sarcoidosis, DM, HTN  Medications:  reviewed  Labs: reviewed  Anthropometrics:   Height: 64 inches Weight: 202 lb BMI: 34.7   NUTRITION DIAGNOSIS: Food and nutrition related knowledge deficit related to new diagnosis of breast cancer as evidenced by no prior need for nutrition related information.  INTERVENTION:   Discussed and provided packet of information regarding nutritional tips for breast cancer patients.  Questions answered.  Teachback method used.      MONITORING, EVALUATION, and GOAL: Pt will consume a healthy plant based diet to maintain lean body mass throughout treatment.   Kristin Arroyo, [place], [place] (pager)

## 2016-07-01 NOTE — Progress Notes (Signed)
Clinical Social Work [place] Psychosocial Distress Screening [place]  Patient completed distress screening protocol and scored a 4 on the Psychosocial Distress Thermometer which indicates mild distress. Clinical Social Worker met with patient and patients family in [facility] to assess for distress and other psychosocial needs. Patient stated she was feeling overwhelmed but felt "better" after meeting with the treatment team and getting more information on her treatment plan. CSW and patient discussed common feeling and emotions when being diagnosed with cancer, and the importance of support during treatment. CSW informed patient of the support team and support services at [facility], and patient was agreeable to an [employer] referral. CSW provided contact information and encouraged patient to call with any questions or concerns.  ONCBCN DISTRESS SCREENING 07/01/2016  Screening Type Initial Screening  Distress experienced in past week (1-10) 4  Emotional problem type Depression;Nervousness/Anxiety;Adjusting to illness;Feeling hopeless  Spiritual/Religous concerns type Relating to God  Information Concerns Type Lack of info about diagnosis;Lack of info about treatment;Lack of info about complementary therapy choices;Lack of info about maintaining fitness  Physical Problem type Sleep/insomnia;Loss of appetitie  Physician notified of physical symptoms Yes  Referral to clinical social work Yes  Referral to support programs Yes     Johnnye Lana, MSW, LCSW, OSW-C Clinical Social Worker [place] [phone]

## 2016-07-01 NOTE — Progress Notes (Signed)
START ON PATHWAY REGIMEN - Breast  [id number]: Anastrozole Daily for 5 Years   Daily:     Anastrozole (Arimidex(R)) 1 mg orally daily Dose Mod: None  **Always confirm dose/schedule in your pharmacy ordering system**    Patient Characteristics: Ductal Carcinoma In Situ, ER Positive and Breast Conserving Surgery, Postmenopausal AJCC Stage Grouping: 0 Current Disease Status: No Distant Mets or Local Recurrence AJCC M Stage: 0 ER Status: Positive (+) AJCC N Stage: 0 AJCC T Stage: is HER2/neu: Unknown PR Status: Positive (+) Menopausal Status? Postmenopausal  Intent of Therapy: Curative Intent, Discussed with Patient

## 2016-07-01 NOTE — Progress Notes (Signed)
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 female who presented with left breast calcifications on screening mammogram. Diagnostic mammogram on 06/17/16 showed a 4 mm group of heterogeneous microcalcification in the UOQ.  Biopsy on 06/23/16 showed DCIS with calcifications and characteristics as described above in the diagnosis. Receptor status is ER 100%, PR 100%.  Patient is positive for weight change, loss of sleep, glasses, blurred vision, hearing loss, ringing in ears, sleeping on 2 pillows, poor appetite, arthritis, anxiety, depression, diabetes, and forgetfulness.  PREVIOUS RADIATION THERAPY: No  PAST MEDICAL HISTORY:  has a past medical history of Arthritis; Diabetes mellitus without complication ([place]); Hypertension; Respiratory disorder; and Sarcoid ([place]).    PAST SURGICAL HISTORY: Past Surgical History:  Procedure Laterality Date  . CESAREAN SECTION    . MOUTH SURGERY      FAMILY HISTORY: family history includes Diabetes in her father and mother; Hypertension in her father and mother.  SOCIAL HISTORY:  reports that she has never smoked. She does not have any smokeless tobacco history on file. She reports that she drinks alcohol.  ALLERGIES: Zithromax [azithromycin]  MEDICATIONS:  Current Outpatient Prescriptions  Medication Sig Dispense Refill  . ADVAIR DISKUS 500-50 MCG/DOSE AEPB Inhale 1 puff into the lungs 2 (two) times daily.   0  . alendronate (FOSAMAX) 70 MG tablet Take 70 mg by mouth once a week. On  Wednesdays  1  . atorvastatin (LIPITOR) 40 MG tablet Take 40 mg by mouth every morning.   1  . cyclobenzaprine (FLEXERIL) 5 MG tablet Take 1 tablet (5 mg total) by mouth 3 (three) times daily as needed. 20 tablet 0  . irbesartan (AVAPRO) 300 MG tablet Take 300 mg by mouth every morning.   1  . methotrexate (RHEUMATREX) 2.5 MG tablet Take 20 mg by mouth once a week. Takes on Thursdays Caution:Chemotherapy. Protect from light.     No current facility-administered medications for this encounter.     REVIEW OF SYSTEMS: A 10+ POINT REVIEW OF SYSTEMS WAS OBTAINED including neurology, dermatology, psychiatry, cardiac, respiratory, lymph, extremities, GI, GU, Musculoskeletal, constitutional, breasts, reproductive, HEENT.  All pertinent positives are noted in the HPI.  All others are negative.   PHYSICAL EXAM:  Vitals with BMI 07/01/2016  Height 5\' 4"   Weight 202 lbs  BMI [id number]  Systolic [id number]  Diastolic 81  Pulse 79  Respirations 20   General: Alert and oriented, in no acute distress HEENT: Head is normocephalic. Extraocular movements are intact. Oropharynx is clear. Neck: Neck is supple, no palpable cervical or supraclavicular lymphadenopathy. Heart: Regular in rate and rhythm with no murmurs, rubs, or gallops. Chest: Clear to auscultation bilaterally, with no rhonchi, wheezes, or rales. Abdomen: Soft, nontender, nondistended, with no rigidity or guarding. Neurologic: Cranial nerves II through XII are grossly intact. No obvious focalities. Speech is fluent. Coordination is intact. Psychiatric: Judgment and insight are intact. Affect is appropriate. Breasts: Small biopsy site at the 1 o'clock position of the left breast with small bruising. There is no nipple discharge or bleeding in the left breast.  There is no palpable mass or nipple discharge in the right breast. No other palpable masses appreciated in the breasts or axillae.   ECOG = 0   LABORATORY DATA:  No results found for: WBC, HGB,  HCT, MCV, PLT CMP  No results found for: NA, K, CL, CO2, GLUCOSE, BUN, CREATININE, CALCIUM, PROT, ALBUMIN, AST, ALT, ALKPHOS, BILITOT, GFRNONAA, GFRAA       RADIOGRAPHY: Mm Digital Diagnostic Unilat L  Result Date: 06/17/2016 CLINICAL DATA:  Small group of microcalcifications in the upper left breast, slightly laterally, on a recent screening mammogram. EXAM: DIGITAL DIAGNOSTIC LEFT MAMMOGRAM COMPARISON:  Previous exam(s). ACR Breast Density Category b: There are scattered areas of fibroglandular density. FINDINGS: Spot magnification views of the left breast demonstrate a 4 x 4 x 2 mm group of heterogeneous microcalcifications in the upper, slightly outer portion of the breast in the middle third. IMPRESSION: 4 mm group of indeterminate microcalcifications in the upper-outer quadrant of the left breast. RECOMMENDATION: Stereotactic guided core needle biopsy of the 4 mm group of indeterminate microcalcifications in the upper-outer quadrant of the left breast. This has been scheduled at 9:30 a.m. on 06/23/2016. I have discussed the findings and recommendations with the patient. Results were also provided in writing at the conclusion of the visit. If applicable, a reminder letter will be sent to the patient regarding the next appointment. BI-RADS CATEGORY  4: Suspicious. Electronically Signed   By: Claudie Revering M.D.   On: 06/17/2016 09:46   Mm Clip Placement Left  Result Date: 06/23/2016 CLINICAL DATA:  Patient status post stereotactic guided core needle biopsy left breast calcifications. EXAM: DIAGNOSTIC LEFT MAMMOGRAM POST STEREOTACTIC BIOPSY COMPARISON:  Previous exam(s). FINDINGS: Mammographic images were obtained following stereotactic guided biopsy of left breast calcifications. X shaped marking clip in appropriate position. IMPRESSION: Appropriate position X shaped marking clip status post stereotactic guided biopsy left breast calcifications. Final Assessment: Post Procedure Mammograms for Marker  Placement Electronically Signed   By: Lovey Newcomer M.D.   On: 06/23/2016 10:33   Mm Lt Breast Bx W Loc Dev 1st Lesion Image Bx Spec Stereo Guide  Addendum Date: 06/26/2016   ADDENDUM REPORT: 06/24/2016 15:25 ADDENDUM: Pathology revealed INTERMEDIATE GRADE DUCTAL CARCINOMA IN SITU WITH ASSOCIATED CALCIFICATIONS of the Left breast, upper outer. This was found to be concordant by Dr. Lovey Newcomer. Pathology results were discussed with the patient by telephone. The patient reported doing well after the biopsy with tenderness and minimal bleeding at the site. Post biopsy instructions and care were reviewed and questions were answered. The patient was encouraged to call [facility] for any additional concerns. The patient was referred to [facility] at [facility] on July 01, 2016. Pathology results reported by Terie Purser, RN on 06/24/2016. Electronically Signed   By: Lovey Newcomer M.D.   On: 06/24/2016 15:25   Result Date: 06/26/2016 CLINICAL DATA:  Patient with indeterminate left breast calcifications. EXAM: LEFT BREAST STEREOTACTIC CORE NEEDLE BIOPSY COMPARISON:  Previous exams. FINDINGS: The patient and I discussed the procedure of stereotactic-guided biopsy including benefits and alternatives. We discussed the high likelihood of a successful procedure. We discussed the risks of the procedure including infection, bleeding, tissue injury, clip migration, and inadequate sampling. Informed written consent was given. The usual time out protocol was performed immediately prior to the procedure. Using sterile technique and 1% Lidocaine as local anesthetic, under stereotactic guidance, a 9 gauge vacuum assisted core needle  biopsy device was used to perform core needle biopsy of calcifications within the upper-outer left breast using a cranial approach. Specimen radiograph was performed showing calcifications. Specimens with  calcifications are identified for pathology. At the conclusion of the procedure, a X shaped tissue marker clip was deployed into the biopsy cavity. Follow-up 2-view mammogram was performed and dictated separately. IMPRESSION: Stereotactic-guided biopsy of left breast calcifications. No apparent complications. Electronically Signed: By: Lovey Newcomer M.D. 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 She will meet with research protocol  nurses in the future concerning this issue. If she decides against this study, she will undergo a lumpectomy and will be reevaluated for radiation in the post operative setting.   Patient will either undergo a left lumpectomy followed by radiation therapy or participate in the COMET trial. She will be prescribed an aromatase inhibitor for 5 years.  It was a pleasure meeting the patient today. We discussed the risks, benefits, and side effects of radiotherapy. I recommend radiotherapy to the left breast to reduce her risk of locoregional recurrence by 2/3.  We discussed that radiation would take approximately 6 weeks to complete and that I would give the patient a few weeks to heal following surgery before starting treatment planning. We spoke about acute effects including skin irritation and fatigue as well as much less common late effects including internal organ injury or irritation. We spoke about the latest technology that is used to minimize the risk of late effects for patients undergoing radiotherapy to the breast or chest wall. No guarantees of treatment were given. The patient is enthusiastic about proceeding with treatment. I look forward  to participating in the patient's care.  I will await her referral back to me for postoperative follow-up and eventual CT simulation/treatment planning.  Blair Promise, MD   __________________________________________   This document serves as a record of services personally performed by Gery Pray, MD. It was created on his behalf by Bethann Humble, a trained medical scribe. The creation of this record is based on the scribe's personal observations and the provider's statements to them. This document has been checked and approved by the attending provider.

## 2016-07-07 ENCOUNTER — Telehealth: Payer: Self-pay | Admitting: *Deleted

## 2016-07-07 NOTE — Telephone Encounter (Signed)
  Oncology Nurse Navigator Documentation  Navigator Location: [facility] (07/07/16 1500)   )Navigator Encounter Type: Telephone (07/07/16 1500) Telephone: Outgoing Call (07/07/16 1500)                                                  Time Spent with Patient: 15 (07/07/16 1500)

## 2016-07-14 ENCOUNTER — Encounter: Payer: BC Managed Care – PPO | Admitting: *Deleted

## 2016-07-24 ENCOUNTER — Encounter: Payer: BC Managed Care – PPO | Admitting: *Deleted

## 2016-08-05 ENCOUNTER — Encounter: Payer: BC Managed Care – PPO | Admitting: *Deleted

## 2016-08-05 ENCOUNTER — Ambulatory Visit (HOSPITAL_BASED_OUTPATIENT_CLINIC_OR_DEPARTMENT_OTHER): Payer: BC Managed Care – PPO | Admitting: Oncology

## 2016-08-05 VITALS — BP 172/60 | HR 82 | Temp 97.8°F | Resp 18 | Ht 64.0 in | Wt 202.3 lb

## 2016-08-05 DIAGNOSIS — Z170 Estrogen receptor positive status [ER+]: Secondary | ICD-10-CM

## 2016-08-05 DIAGNOSIS — D0512 Intraductal carcinoma in situ of left breast: Secondary | ICD-10-CM | POA: Diagnosis not present

## 2016-08-05 DIAGNOSIS — C50412 Malignant neoplasm of upper-outer quadrant of left female breast: Secondary | ICD-10-CM

## 2016-08-05 MED ORDER — ANASTROZOLE 1 MG PO TABS: 1.0000 mg | ORAL_TABLET | Freq: Every day | ORAL | 4 refills | Status: DC

## 2016-08-05 NOTE — Progress Notes (Signed)
[place]  [phone] [phone]) [phone]     ID: Kristin Arroyo DOB: Mar 01, 1951  MR#: [id number]  JYN#:[id number]  Patient Care Team: Prince Solian, MD as PCP - General (Internal Medicine) Excell Seltzer, MD as Consulting Physician (General Surgery) Chauncey Cruel, MD as Consulting Physician (Oncology) Gery Pray, MD as Consulting Physician (Radiation Oncology) Azucena Fallen, MD as Consulting Physician (Obstetrics and Gynecology) Valinda Party, MD (Rheumatology) Brion Aliment, RN as Registered Nurse Chauncey Cruel, MD OTHER MD:  CHIEF COMPLAINT: Ductal carcinoma in situ  CURRENT TREATMENT: Awaiting definitive surgery  BREAST CANCER HISTORY: From the original intake note:  The patient had routine screening mammography November 2017 showing an area of microcalcifications in the left breast. She was referred for diagnostic mammography at the [facility] 06/17/2016 and this showed the breast density to be category B. In the upper-outer quadrant of the left breast there was an area of heterogeneous microcalcifications measuring 0.4 cm.  On 06/23/2016 she underwent upper outer quadrant left breast biopsy of the microcalcifications in question and this showed ([id number]) ductal carcinoma in situ, grade 2, estrogen and progesterone receptor both 100% positive with strong staining intensity.  Her subsequent history is as detailed below.  INTERVAL HISTORY: Kristin Arroyo returns today with her daughter. At the last visit we discussed the COMMENT trial and the patient had expressed a strong interest in participating. Today as we discussed this further what she really heard was that she might not need to have any surgery. She did not realize that would be a randomization and close follow-up for example all that was clarified today. At this point she is very much interested in proceeding to definitive surgery  REVIEW OF SYSTEMS: She has mild sinus symptoms.  She has a little bit of a dry cough. She has her usual rheumatoid arthritis symptoms which are not more intense or persistent than before. She feels forgetful. She is tolerating methotrexate with no difficulties. A detailed review of systems today was otherwise stable   PAST MEDICAL HISTORY: Past Medical History:  Diagnosis Date  . Anxiety   . Arthritis   . Depression   . Diabetes mellitus without complication ([place])   . GERD (gastroesophageal reflux disease)   . Hypertension   . Respiratory disorder   . Sarcoid ([place])     PAST SURGICAL HISTORY: Past Surgical History:  Procedure Laterality Date  . CESAREAN SECTION    . MOUTH SURGERY      FAMILY HISTORY Family History  Problem Relation Age of Onset  . Diabetes Mother   . Hypertension Mother   . Diabetes Father   . Hypertension Father   Patient's father died in his 64s from heart disease. The patient's mother experienced sudden death at age 21. The patient had 2 brothers, 1 sister. There is a family history of melanoma.  GYNECOLOGIC HISTORY:  No LMP recorded. Patient is postmenopausal. Menarche age 52, first live birth age 27. The patient is GX P1. She went through menopause in her early 69s. She did not take hormone replacement. She used oral contraceptives for some time in the remote past without complications.  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 At home it's just her "and Jesus", with no pets. Her daughter [name] lives in [place] him a and works in [employer] setting. The patient has no grandchildren. She attends a [employer]ic[employer], medical church.     ADVANCED DIRECTIVES: Not in place  HEALTH MAINTENANCE: Social History  Substance Use Topics  . Smoking status: Former Smoker    Quit date: 07/01/1974  . Smokeless tobacco: Not on file  . Alcohol use Yes     Comment: occ     Colonoscopy:2016/[facility]  PAP: 2017  Bone density: 05/14/2009 at [facility], T score 0.4 (normal)   Allergies  Allergen Reactions  . Zithromax [Azithromycin] Hives    Current Outpatient Prescriptions  Medication Sig Dispense Refill  . ADVAIR DISKUS 500-50 MCG/DOSE AEPB Inhale 1 puff into the lungs 2 (two) times daily.   0  . alendronate (FOSAMAX) 70 MG tablet Take 70 mg by mouth once a week. On Wednesdays  1  . anastrozole (ARIMIDEX) 1 MG tablet Take 1 tablet (1 mg total) by mouth daily. 90 tablet 4  . atorvastatin (LIPITOR) 40 MG tablet Take 40 mg by mouth every morning.   1  . cyclobenzaprine (FLEXERIL) 5 MG tablet Take 1 tablet (5 mg total) by mouth 3 (three) times daily as needed. 20 tablet 0  . irbesartan (AVAPRO) 300 MG tablet Take 300 mg by mouth every morning.   1  . methotrexate (RHEUMATREX) 2.5 MG tablet Take 20 mg by mouth once a week. Takes on Thursdays Caution:Chemotherapy. Protect from light.     No current facility-administered medications for this visit.     OBJECTIVE: Middle-aged African-American womanIn no acute distress Vitals:   08/05/16 1441  BP: (!) 172/60  Pulse: 82  Resp: 18  Temp: 97.8 F (36.6 C)     Body mass index is 34.72 kg/m.    ECOG FS:0 - Asymptomatic  Sclerae unicteric, pupils round and equal Oropharynx clear and moist-- no thrush or other lesions No cervical or supraclavicular adenopathy Lungs no rales or rhonchi Heart regular rate and rhythm Abd soft, nontender, positive bowel sounds MSK no focal spinal tenderness, no upper extremity lymphedema Neuro: nonfocal, well oriented, appropriate affect Breasts: The right breast is unremarkable. I do not palpate any masses in the left breast and there are no skin or nipple changes of concern. Both axillae are benign.  LAB RESULTS:  CMP     Component Value Date/Time   NA 143 07/01/2016 0847   K 3.7 07/01/2016 0847   CO2 29 07/01/2016 0847   GLUCOSE 124 07/01/2016 0847   BUN 7.5 07/01/2016 0847   CREATININE 0.7 07/01/2016 0847   CALCIUM 9.1  07/01/2016 0847   PROT 7.1 07/01/2016 0847   ALBUMIN 3.4 (L) 07/01/2016 0847   AST 17 07/01/2016 0847   ALT 17 07/01/2016 0847   ALKPHOS 77 07/01/2016 0847   BILITOT 0.54 07/01/2016 0847    INo results found for: SPEP, UPEP  Lab Results  Component Value Date   WBC 4.5 07/01/2016   NEUTROABS 2.5 07/01/2016   HGB 12.8 07/01/2016   HCT 40.8 07/01/2016   MCV 76.8 (L) 07/01/2016   PLT 177 07/01/2016      Chemistry      Component Value Date/Time   NA 143 07/01/2016 0847   K 3.7 07/01/2016 0847   CO2 29 07/01/2016 0847   BUN 7.5 07/01/2016 0847   CREATININE 0.7 07/01/2016 0847      Component Value Date/Time   CALCIUM 9.1 07/01/2016 0847   ALKPHOS 77 07/01/2016 0847   AST 17 07/01/2016 0847   ALT 17 07/01/2016 0847   BILITOT 0.54 07/01/2016 0847       No results found for: LABCA2  No components found for: EXHBZ169  No results for input(s): INR in the last 168 hours.  Urinalysis No results found for: COLORURINE, APPEARANCEUR, LABSPEC, PHURINE, GLUCOSEU, HGBUR, BILIRUBINUR, KETONESUR, PROTEINUR, UROBILINOGEN, NITRITE, LEUKOCYTESUR   STUDIES: No results found.  ELIGIBLE FOR AVAILABLE RESEARCH PROTOCOL:  Decided against COMET  trial  ASSESSMENT: 66 y.o. [place] woman status post left breast upper outer quadrant biopsy 06/17/2016 for ductal carcinoma in situ, grade 2, estrogen and progesterone receptor positive  (1)  Definitive surgery pending  (2) starting anastrozole  08/05/2016  PLAN: Ms Schaad Met with my partner, Dr. Sonny Dandy, to better understand the details of the COMMENT trial. She was confused about the process of randomization and what it entailed. At the end of the discussion she felt pretty clear that she did not want to participate in the trial.  I then met with her for an additional 30 minutes. What no brought once at this point is to go ahead and get the cancer out of my breast". She is agreeable to surgery. She understands the standard of care after  that his radiation but she is very reluctant to consider that. She is willing to consider anti-estrogens however.  In fact because she is so concerned regarding delays, we discussed anastrozole in detail today. She has a good understanding of the possible toxicities, side effects and complications of this agent and she is going to "do more reading" on her own about it. But she did want me to go ahead and enter the order which I did. I also alerted her to some price issues that can occur with this drug.  Assuming all goes as planned, she will start the anastrozole now, and will proceed to surgery at the discretion of Dr. Excell Seltzer. She will then see me again in about 6 weeks or so to review the results of the final surgery and to see how she is tolerating the anastrozole. If she does well without drug the plan would be to continue it for a total of 5 years.  She knows to call for any problems that may develop before her next visit here   Chauncey Cruel, MD   08/05/2016 3:55 PM Medical Oncology and Hematology [facility] [place], [place] [address] Tel. [phone]    Fax. [phone]

## 2016-08-07 ENCOUNTER — Ambulatory Visit: Payer: Self-pay | Admitting: General Surgery

## 2016-08-07 DIAGNOSIS — D0512 Intraductal carcinoma in situ of left breast: Secondary | ICD-10-CM

## 2016-08-13 ENCOUNTER — Other Ambulatory Visit: Payer: Self-pay | Admitting: General Surgery

## 2016-08-13 ENCOUNTER — Telehealth: Payer: Self-pay | Admitting: *Deleted

## 2016-08-13 DIAGNOSIS — D0512 Intraductal carcinoma in situ of left breast: Secondary | ICD-10-CM

## 2016-08-13 HISTORY — PX: BREAST LUMPECTOMY: SHX2

## 2016-08-13 NOTE — Telephone Encounter (Signed)
"  I started Anastrozole 08-08-2016, took this for two days.  Monday 08-10-2016, I noticed severe pain around the circumference of my head and joints (arms, shoulders) so I was afraid to continue this.  He asked me to call if this medicine caused any pain.  I felt pain yesterday but it was not as severe.  Does Dr. Doris Cheadle want me to continue, stop taking this or try something different. I do have Rheumatoid arthritis but I've never had joint pain.    I am also concerned about Anastrozole causing other cancers to erupt other places in the body but forgot to ask this when I was seen.  I can be reached at work today [phone] from 8:00 am to 1:30 pm and 2:30 pm to 5:00 pm.  They can call the home [phone] and leave a message."

## 2016-08-17 ENCOUNTER — Telehealth: Payer: Self-pay | Admitting: *Deleted

## 2016-08-17 NOTE — Telephone Encounter (Signed)
This RN attempted to reach pt per given number on VM of [phone] stating " no one has called me back from last week "  This RN has note per communication on 08/13/2016 stating pt's concerns - and per MD recommendation for pt to hold the arimidex and call in 10 days.  Noted attempts on handwritten paper by RN to reach pt.  This RN obtained VM and left message stating need to hold arimidex and requested return call for communication.

## 2016-08-19 ENCOUNTER — Telehealth: Payer: Self-pay

## 2016-08-19 DIAGNOSIS — M069 Rheumatoid arthritis, unspecified: Secondary | ICD-10-CM | POA: Insufficient documentation

## 2016-08-19 NOTE — Telephone Encounter (Signed)
Noted phone communication dated 08-17-2016.  Called patient's home.  Leaving message to hold anastrozole during which her daughter picked up.  "I just spoke with her so I know she's at her desk.  Do you need that number?"  Confirmed number on file.  Called leaving message on work voicemail identified for this patient.   Asked for return call to confirm receipt of message.

## 2016-08-19 NOTE — Telephone Encounter (Signed)
Pt states she received our message to hold arimidex for 10 days. She stopped it on 1/1. She described a headache and that her thumb folded into her palm and she could not straighten it for awhile.  She will call on Monday with an update how she feels.   She states she also has rheumatiod arthritis and is seeing an MD at [facility]. Updated problem list.

## 2016-08-28 ENCOUNTER — Encounter (HOSPITAL_BASED_OUTPATIENT_CLINIC_OR_DEPARTMENT_OTHER): Payer: Self-pay | Admitting: *Deleted

## 2016-09-03 ENCOUNTER — Ambulatory Visit
Admission: RE | Admit: 2016-09-03 | Discharge: 2016-09-03 | Disposition: A | Discharge status: Home / Self Care | Payer: BC Managed Care – PPO | Source: Ambulatory Visit | Attending: General Surgery | Admitting: General Surgery

## 2016-09-03 DIAGNOSIS — D0512 Intraductal carcinoma in situ of left breast: Secondary | ICD-10-CM

## 2016-09-03 NOTE — H&P (Signed)
History of Present Illness  The patient is a 66 year old female who presents with breast cancer. She is a postmenopausal female referred by Dr. Dorise Bullion for evaluation of recently diagnosed carcinoma of the left breast. She recently presented for a screening mamogram revealing new calcifications in the left breast. Subsequent imaging included diagnostic mamogram showing a 4 mm group of heterogeneous indeterminant microcalcifications in the upper slightly outer left breast. 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 She is seen now in breast multidisciplinary clinic for initial treatment planning. She has experienced no breast symptoms, specifically lump, nipple discharge or crusting, other skin changes or pain. She does not have a personal history of any previous breast problems.  Findings at that time were the following: Tumor size: 0.4 cm Tumor grade: intermediate Estrogen Receptor: 100% positive Progesterone Receptor: 100% positive     Problem List/Past Medical  HTN (HYPERTENSION), BENIGN (I10)  H/O RHEUMATOID ARTHRITIS (Z87.39)  SARCOIDOSIS (D86.9)  CHRONIC GERD (K21.9)   Past Surgical History Foot Surgery   Social History  Light alcohol use (< 2 drinks/day)     Physical Exam The physical exam findings are as follows: Note:General: Alert, well-developed and well nourished African-American female, in no distress Skin: Warm and dry without rash or infection. HEENT: No palpable masses or thyromegaly. Sclera nonicteric. Pupils equal round and reactive. Lymph nodes: No cervical, supraclavicular, nodes palpable. Breasts: No palpable masses in either breast. Possibly some thickening upper left breast at the biopsy site. No skin changes or nipple crusting or discharge. Lungs: Breath sounds clear and equal. No wheezing or increased work of breathing. Cardiovascular:  Regular rate and rhythm without murmer. No JVD or edema. Abdomen: Nondistended. Soft and nontender. No masses palpable. No organomegaly. No palpable hernias. Extremities: No edema or joint swelling or deformity. No chronic venous stasis changes. Neurologic: Alert and fully oriented. Gait normal. No focal weakness. Psychiatric: Normal mood and affect. 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 I discussed options with the patient. We discussed that standard therapy would include lumpectomy or mastectomy although she would be a good candidate for lumpectomy. Radiation may or may not be required based on final results of surgery. We discussed in general terms antiestrogen therapy.  We also discussed that she would be a candidate for the COMET trial. I think based on the very small size for tumor she would be an ideal candidate.   Patient initially elected the COMET trial but has now decided to proceed with lumpectomy

## 2016-09-04 ENCOUNTER — Encounter (HOSPITAL_BASED_OUTPATIENT_CLINIC_OR_DEPARTMENT_OTHER): Payer: Self-pay | Admitting: Certified Registered"

## 2016-09-04 ENCOUNTER — Ambulatory Visit (HOSPITAL_BASED_OUTPATIENT_CLINIC_OR_DEPARTMENT_OTHER): Payer: BC Managed Care – PPO | Admitting: Certified Registered"

## 2016-09-04 ENCOUNTER — Ambulatory Visit
Admission: RE | Admit: 2016-09-04 | Discharge: 2016-09-04 | Disposition: A | Discharge status: Home / Self Care | Payer: BC Managed Care – PPO | Source: Ambulatory Visit | Attending: General Surgery | Admitting: General Surgery

## 2016-09-04 ENCOUNTER — Ambulatory Visit (HOSPITAL_BASED_OUTPATIENT_CLINIC_OR_DEPARTMENT_OTHER)
Admission: RE | Admit: 2016-09-04 | Discharge: 2016-09-04 | Disposition: A | Discharge status: Home / Self Care | Payer: BC Managed Care – PPO | Source: Ambulatory Visit | Attending: General Surgery | Admitting: General Surgery

## 2016-09-04 ENCOUNTER — Encounter (HOSPITAL_BASED_OUTPATIENT_CLINIC_OR_DEPARTMENT_OTHER)
Admission: RE | Disposition: A | Discharge status: Home / Self Care | Payer: Self-pay | Source: Ambulatory Visit | Attending: General Surgery

## 2016-09-04 DIAGNOSIS — K219 Gastro-esophageal reflux disease without esophagitis: Secondary | ICD-10-CM | POA: Insufficient documentation

## 2016-09-04 DIAGNOSIS — Z87891 Personal history of nicotine dependence: Secondary | ICD-10-CM | POA: Insufficient documentation

## 2016-09-04 DIAGNOSIS — F418 Other specified anxiety disorders: Secondary | ICD-10-CM | POA: Diagnosis not present

## 2016-09-04 DIAGNOSIS — M1990 Unspecified osteoarthritis, unspecified site: Secondary | ICD-10-CM | POA: Insufficient documentation

## 2016-09-04 DIAGNOSIS — M069 Rheumatoid arthritis, unspecified: Secondary | ICD-10-CM | POA: Diagnosis not present

## 2016-09-04 DIAGNOSIS — I10 Essential (primary) hypertension: Secondary | ICD-10-CM | POA: Insufficient documentation

## 2016-09-04 DIAGNOSIS — Z170 Estrogen receptor positive status [ER+]: Secondary | ICD-10-CM | POA: Insufficient documentation

## 2016-09-04 DIAGNOSIS — D0512 Intraductal carcinoma in situ of left breast: Secondary | ICD-10-CM | POA: Insufficient documentation

## 2016-09-04 DIAGNOSIS — D869 Sarcoidosis, unspecified: Secondary | ICD-10-CM | POA: Insufficient documentation

## 2016-09-04 HISTORY — PX: BREAST LUMPECTOMY WITH RADIOACTIVE SEED LOCALIZATION: SHX6424

## 2016-09-04 LAB — POCT HEMOGLOBIN-HEMACUE: HEMOGLOBIN: 13.5 g/dL (ref 12.0–15.0)

## 2016-09-04 SURGERY — BREAST LUMPECTOMY WITH RADIOACTIVE SEED LOCALIZATION
Anesthesia: General | Site: Breast | Laterality: Left

## 2016-09-04 MED ORDER — FENTANYL CITRATE (PF) 100 MCG/2ML IJ SOLN
INTRAMUSCULAR | Status: AC
  Filled 2016-09-04: qty 2

## 2016-09-04 MED ORDER — METOCLOPRAMIDE HCL 5 MG/ML IJ SOLN: 10.0000 mg | Freq: Once | INTRAMUSCULAR | Status: DC | PRN

## 2016-09-04 MED ORDER — MIDAZOLAM HCL 2 MG/2ML IJ SOLN: 1.0000 mg | INTRAMUSCULAR | Status: DC | PRN

## 2016-09-04 MED ORDER — HYDROCODONE-ACETAMINOPHEN 5-325 MG PO TABS: 1.0000 | ORAL_TABLET | ORAL | 0 refills | Status: DC | PRN

## 2016-09-04 MED ORDER — MEPERIDINE HCL 25 MG/ML IJ SOLN: 6.2500 mg | INTRAMUSCULAR | Status: DC | PRN

## 2016-09-04 MED ORDER — ACETAMINOPHEN 500 MG PO TABS
1000.0000 mg | ORAL_TABLET | ORAL | Status: AC
  Administered 2016-09-04: 1000 mg via ORAL

## 2016-09-04 MED ORDER — LIDOCAINE 2% (20 MG/ML) 5 ML SYRINGE
INTRAMUSCULAR | Status: AC
  Filled 2016-09-04: qty 5

## 2016-09-04 MED ORDER — CEFAZOLIN SODIUM-DEXTROSE 2-4 GM/100ML-% IV SOLN
2.0000 g | INTRAVENOUS | Status: AC
  Administered 2016-09-04: 2 g via INTRAVENOUS

## 2016-09-04 MED ORDER — ONDANSETRON HCL 4 MG/2ML IJ SOLN
INTRAMUSCULAR | Status: AC
  Filled 2016-09-04: qty 2

## 2016-09-04 MED ORDER — ONDANSETRON HCL 4 MG/2ML IJ SOLN
INTRAMUSCULAR | Status: DC | PRN
  Administered 2016-09-04: 4 mg via INTRAVENOUS

## 2016-09-04 MED ORDER — CEFAZOLIN SODIUM-DEXTROSE 2-4 GM/100ML-% IV SOLN
INTRAVENOUS | Status: AC
  Filled 2016-09-04: qty 100

## 2016-09-04 MED ORDER — GABAPENTIN 300 MG PO CAPS
300.0000 mg | ORAL_CAPSULE | ORAL | Status: AC
Start: 2016-09-04 — End: 2016-09-04
  Administered 2016-09-04: 300 mg via ORAL

## 2016-09-04 MED ORDER — LACTATED RINGERS IV SOLN
INTRAVENOUS | Status: DC
  Administered 2016-09-04 (×2): via INTRAVENOUS

## 2016-09-04 MED ORDER — EPHEDRINE 5 MG/ML INJ
INTRAVENOUS | Status: AC
  Filled 2016-09-04: qty 20

## 2016-09-04 MED ORDER — CELECOXIB 200 MG PO CAPS
ORAL_CAPSULE | ORAL | Status: AC
  Filled 2016-09-04: qty 2

## 2016-09-04 MED ORDER — PROPOFOL 10 MG/ML IV BOLUS
INTRAVENOUS | Status: DC | PRN
  Administered 2016-09-04: 150 mg via INTRAVENOUS

## 2016-09-04 MED ORDER — FENTANYL CITRATE (PF) 100 MCG/2ML IJ SOLN: 25.0000 ug | INTRAMUSCULAR | Status: DC | PRN

## 2016-09-04 MED ORDER — 0.9 % SODIUM CHLORIDE (POUR BTL) OPTIME
TOPICAL | Status: DC | PRN
  Administered 2016-09-04: 200 mL

## 2016-09-04 MED ORDER — FENTANYL CITRATE (PF) 100 MCG/2ML IJ SOLN
50.0000 ug | INTRAMUSCULAR | Status: DC | PRN
  Administered 2016-09-04: 100 ug via INTRAVENOUS

## 2016-09-04 MED ORDER — GABAPENTIN 300 MG PO CAPS
ORAL_CAPSULE | ORAL | Status: AC
  Filled 2016-09-04: qty 1

## 2016-09-04 MED ORDER — LIDOCAINE HCL (CARDIAC) 20 MG/ML IV SOLN
INTRAVENOUS | Status: DC | PRN
  Administered 2016-09-04: 60 mg via INTRAVENOUS

## 2016-09-04 MED ORDER — CHLORHEXIDINE GLUCONATE CLOTH 2 % EX PADS: 6.0000 | MEDICATED_PAD | Freq: Once | CUTANEOUS | Status: DC

## 2016-09-04 MED ORDER — DEXAMETHASONE SODIUM PHOSPHATE 4 MG/ML IJ SOLN
INTRAMUSCULAR | Status: DC | PRN
  Administered 2016-09-04: 4 mg via INTRAVENOUS

## 2016-09-04 MED ORDER — DEXAMETHASONE SODIUM PHOSPHATE 10 MG/ML IJ SOLN
INTRAMUSCULAR | Status: AC
  Filled 2016-09-04: qty 1

## 2016-09-04 MED ORDER — SCOPOLAMINE 1 MG/3DAYS TD PT72: 1.0000 | MEDICATED_PATCH | Freq: Once | TRANSDERMAL | Status: DC | PRN

## 2016-09-04 MED ORDER — CELECOXIB 400 MG PO CAPS
400.0000 mg | ORAL_CAPSULE | ORAL | Status: AC
  Administered 2016-09-04: 400 mg via ORAL

## 2016-09-04 MED ORDER — BUPIVACAINE-EPINEPHRINE 0.5% -1:200000 IJ SOLN
INTRAMUSCULAR | Status: DC | PRN
  Administered 2016-09-04: 30 mL

## 2016-09-04 MED ORDER — ACETAMINOPHEN 500 MG PO TABS
ORAL_TABLET | ORAL | Status: AC
  Filled 2016-09-04: qty 2

## 2016-09-04 SURGICAL SUPPLY — 51 items
ADH SKN CLS APL DERMABOND .7 (GAUZE/BANDAGES/DRESSINGS) ×1
BINDER BREAST LRG (GAUZE/BANDAGES/DRESSINGS) IMPLANT
BINDER BREAST MEDIUM (GAUZE/BANDAGES/DRESSINGS) IMPLANT
BINDER BREAST XLRG (GAUZE/BANDAGES/DRESSINGS) ×1 IMPLANT
BINDER BREAST XXLRG (GAUZE/BANDAGES/DRESSINGS) IMPLANT
BLADE SURG 15 STRL LF DISP TIS (BLADE) ×1 IMPLANT
BLADE SURG 15 STRL SS (BLADE) ×2
CANISTER SUC SOCK COL 7IN (MISCELLANEOUS) IMPLANT
CANISTER SUCT 1200ML W/VALVE (MISCELLANEOUS) IMPLANT
CHLORAPREP W/TINT 26ML (MISCELLANEOUS) ×2 IMPLANT
CLIP TI WIDE RED SMALL 6 (CLIP) ×1 IMPLANT
COVER BACK TABLE 60X90IN (DRAPES) ×2 IMPLANT
COVER MAYO STAND STRL (DRAPES) ×2 IMPLANT
COVER PROBE W GEL 5X96 (DRAPES) ×2 IMPLANT
DECANTER SPIKE VIAL GLASS SM (MISCELLANEOUS) IMPLANT
DERMABOND ADVANCED (GAUZE/BANDAGES/DRESSINGS) ×1
DERMABOND ADVANCED .7 DNX12 (GAUZE/BANDAGES/DRESSINGS) ×1 IMPLANT
DEVICE DUBIN W/COMP PLATE 8390 (MISCELLANEOUS) ×2 IMPLANT
DRAPE LAPAROSCOPIC ABDOMINAL (DRAPES) ×2 IMPLANT
DRAPE UTILITY XL STRL (DRAPES) ×2 IMPLANT
DRSG PAD ABDOMINAL 8X10 ST (GAUZE/BANDAGES/DRESSINGS) ×1 IMPLANT
ELECT COATED BLADE 2.86 ST (ELECTRODE) ×2 IMPLANT
ELECT REM PT RETURN 9FT ADLT (ELECTROSURGICAL) ×2
ELECTRODE REM PT RTRN 9FT ADLT (ELECTROSURGICAL) ×1 IMPLANT
GLOVE BIO SURGEON STRL SZ 6.5 (GLOVE) ×1 IMPLANT
GLOVE BIOGEL PI IND STRL 7.0 (GLOVE) IMPLANT
GLOVE BIOGEL PI IND STRL 8 (GLOVE) ×1 IMPLANT
GLOVE BIOGEL PI INDICATOR 7.0 (GLOVE) ×2
GLOVE BIOGEL PI INDICATOR 8 (GLOVE) ×1
GLOVE ECLIPSE 7.5 STRL STRAW (GLOVE) ×2 IMPLANT
GLOVE SURG SS PI 6.5 STRL IVOR (GLOVE) ×1 IMPLANT
GOWN STRL REUS W/ TWL LRG LVL3 (GOWN DISPOSABLE) ×1 IMPLANT
GOWN STRL REUS W/ TWL XL LVL3 (GOWN DISPOSABLE) ×1 IMPLANT
GOWN STRL REUS W/TWL LRG LVL3 (GOWN DISPOSABLE) ×2
GOWN STRL REUS W/TWL XL LVL3 (GOWN DISPOSABLE) ×4
ILLUMINATOR WAVEGUIDE N/F (MISCELLANEOUS) IMPLANT
KIT MARKER MARGIN INK (KITS) ×2 IMPLANT
NDL HYPO 25X1 1.5 SAFETY (NEEDLE) ×1 IMPLANT
NEEDLE HYPO 25X1 1.5 SAFETY (NEEDLE) ×2 IMPLANT
NS IRRIG 1000ML POUR BTL (IV SOLUTION) ×1 IMPLANT
PACK BASIN DAY SURGERY FS (CUSTOM PROCEDURE TRAY) ×2 IMPLANT
PENCIL BUTTON HOLSTER BLD 10FT (ELECTRODE) ×2 IMPLANT
SLEEVE SCD COMPRESS KNEE MED (MISCELLANEOUS) ×2 IMPLANT
SPONGE LAP 4X18 X RAY DECT (DISPOSABLE) ×2 IMPLANT
SUT MON AB 5-0 PS2 18 (SUTURE) ×2 IMPLANT
SUT VICRYL 3-0 CR8 SH (SUTURE) ×2 IMPLANT
SYR CONTROL 10ML LL (SYRINGE) ×2 IMPLANT
TOWEL OR 17X24 6PK STRL BLUE (TOWEL DISPOSABLE) ×2 IMPLANT
TOWEL OR NON WOVEN STRL DISP B (DISPOSABLE) ×2 IMPLANT
TUBE CONNECTING 20X1/4 (TUBING) IMPLANT
YANKAUER SUCT BULB TIP NO VENT (SUCTIONS) IMPLANT

## 2016-09-04 NOTE — Anesthesia Procedure Notes (Signed)
Procedure Name: LMA Insertion Date/Time: 09/04/2016 10:16 AM Performed by: Kaysie Michelini D Pre-anesthesia Checklist: Patient identified, Emergency Drugs available, Suction available and Patient being monitored Patient Re-evaluated:Patient Re-evaluated prior to inductionOxygen Delivery Method: Circle system utilized Preoxygenation: Pre-oxygenation with 100% oxygen Intubation Type: IV induction Ventilation: Mask ventilation without difficulty LMA: LMA inserted LMA Size: 4.0 Number of attempts: 1 Airway Equipment and Method: Bite block Placement Confirmation: positive ETCO2 Tube secured with: Tape Dental Injury: Teeth and Oropharynx as per pre-operative assessment

## 2016-09-04 NOTE — Interval H&P Note (Signed)
History and Physical Interval Note:  09/04/2016 9:52 AM  Kristin Arroyo  has presented today for surgery, with the diagnosis of LEFT BREAST DCIS  The various methods of treatment have been discussed with the patient and family. After consideration of risks, benefits and other options for treatment, the patient has consented to  Procedure(s): BREAST LUMPECTOMY WITH RADIOACTIVE SEED LOCALIZATION (Left) as a surgical intervention .  The patient's history has been reviewed, patient examined, no change in status, stable for surgery.  I have reviewed the patient's chart and labs.  Questions were answered to the patient's satisfaction.     Vana Arif T

## 2016-09-04 NOTE — Anesthesia Postprocedure Evaluation (Signed)
Anesthesia Post Note  Patient: Kristin Arroyo  Procedure(s) Performed: Procedure(s) (LRB): BREAST LUMPECTOMY WITH RADIOACTIVE SEED LOCALIZATION (Left)  Patient location during evaluation: PACU Anesthesia Type: General Level of consciousness: awake and alert Pain management: pain level controlled Vital Signs Assessment: post-procedure vital signs reviewed and stable Respiratory status: spontaneous breathing, nonlabored ventilation, respiratory function stable and patient connected to nasal cannula oxygen Cardiovascular status: blood pressure returned to baseline and stable Postop Assessment: no signs of nausea or vomiting Anesthetic complications: no       Last Vitals:  Vitals:   09/04/16 1201 09/04/16 1233  BP:  137/72  Pulse: 67 66  Resp: (!) 23 18  Temp:  36.5 C    Last Pain:  Vitals:   09/04/16 1233  TempSrc: Oral  PainSc: 0-No pain                 Montez Hageman

## 2016-09-04 NOTE — Op Note (Signed)
Preoperative Diagnosis: LEFT BREAST DCIS  Postoprative Diagnosis: LEFT BREAST DCIS  Procedure: Procedure(s): BREAST LUMPECTOMY WITH RADIOACTIVE SEED LOCALIZATION   Surgeon: Excell Seltzer T   Assistants: None  Anesthesia:  General LMA anesthesia  Indications: Patient is a 66 year old female with a recent diagnosis of low-grade ductal carcinoma in situ of the left breast with a small, 4 mm area of calcifications in the central breast. After extensive discussion regarding treatment options we have elected to proceed with left breast lumpectomy as initial surgical treatment.    Procedure Detail:  Patient had previously undergone accurate placement of a radioactive seed at the clip and tumor site. See placement was confirmed with the neoprobe holding area. She was taken to the operating room, placed in the supine position on the operating table and laryngeal mask general anesthesia induced. She received preoperative IV antibiotics and PAS were placed. The left breast was widely sterilely prepped and draped and patient timeout performed and correct procedure verified. The neoprobe was used to localize the seed in the central upper left breast. A curvilinear circumareolar incision was used and dissection carried down through the subcutaneous tissue to the breast capsule. A short skin and subcutaneous flap was raised superiorly over the seed using the neoprobe for guidance. I then excised a generous specimen approximately 3 cm of breast tissue around the seed using the neoprobe for guidance. The specimen was removed and inked for margins. Specimen x-ray showed the seed and marking clip centrally located within the specimen. This was sent for permanent pathology. Complete hemostasis was obtained in the wound. The cavity was marked with clips. Breast and subcutaneous Tissue was closed with interrupted 3-0 Vicryl. Skin was closed with running septic 5-0 Monocryl and Dermabond. Sponge needle and instrument  counts were correct.    Findings: As above  Estimated Blood Loss:  Minimal         Drains: None  Blood Given: none          Specimens: Left breast tissue        Complications:  * No complications entered in OR log *         Disposition: PACU - hemodynamically stable.         Condition: stable

## 2016-09-04 NOTE — Anesthesia Preprocedure Evaluation (Addendum)
Anesthesia Evaluation  Patient identified by MRN, date of birth, ID band Patient awake    Reviewed: Allergy & Precautions, NPO status , Patient's Chart, lab work & pertinent test results  Airway Mallampati: I  TM Distance: >3 FB Neck ROM: Full    Dental no notable dental hx.    Pulmonary former smoker,  Sarcoidosis, in remission   Pulmonary exam normal breath sounds clear to auscultation       Cardiovascular hypertension, Pt. on medications Normal cardiovascular exam Rhythm:Regular Rate:Normal     Neuro/Psych PSYCHIATRIC DISORDERS Anxiety Depression negative neurological ROS     GI/Hepatic Neg liver ROS, GERD  Medicated and Controlled,  Endo/Other  diabetes, Type 2  Renal/GU negative Renal ROS  negative genitourinary   Musculoskeletal  (+) Arthritis ,   Abdominal   Peds negative pediatric ROS (+)  Hematology negative hematology ROS (+)   Anesthesia Other Findings   Reproductive/Obstetrics negative OB ROS                            Anesthesia Physical Anesthesia Plan  ASA: III  Anesthesia Plan: General   Post-op Pain Management:    Induction: Intravenous  Airway Management Planned: LMA  Additional Equipment:   Intra-op Plan:   Post-operative Plan: Extubation in OR  Informed Consent: I have reviewed the patients History and Physical, chart, labs and discussed the procedure including the risks, benefits and alternatives for the proposed anesthesia with the patient or authorized representative who has indicated his/her understanding and acceptance.   Dental advisory given  Plan Discussed with: CRNA  Anesthesia Plan Comments:         Anesthesia Quick Evaluation

## 2016-09-04 NOTE — Transfer of Care (Signed)
Immediate Anesthesia Transfer of Care Note  Patient: Kristin Arroyo  Procedure(s) Performed: Procedure(s): BREAST LUMPECTOMY WITH RADIOACTIVE SEED LOCALIZATION (Left)  Patient Location: PACU  Anesthesia Type:General  Level of Consciousness: awake and patient cooperative  Airway & Oxygen Therapy: Patient Spontanous Breathing and Patient connected to face mask oxygen  Post-op Assessment: Report given to RN and Post -op Vital signs reviewed and stable  Post vital signs: Reviewed and stable  Last Vitals:  Vitals:   09/04/16 0902  BP: (!) 178/78  Pulse: 72  Resp: 16  Temp: 36.6 C    Last Pain:  Vitals:   09/04/16 0902  TempSrc: Oral      Patients Stated Pain Goal: 0 ([id number])  Complications: No apparent anesthesia complications

## 2016-09-04 NOTE — Discharge Instructions (Signed)
[facility] °Office Phone Number [phone] ° °BREAST BIOPSY/ PARTIAL MASTECTOMY: POST OP INSTRUCTIONS ° °Always review your discharge instruction sheet given to you by the facility where your surgery was performed. ° °IF YOU HAVE DISABILITY OR FAMILY LEAVE FORMS, YOU MUST BRING THEM TO THE OFFICE FOR PROCESSING.  DO NOT GIVE THEM TO YOUR DOCTOR. ° °1. A prescription for pain medication may be given to you upon discharge.  Take your pain medication as prescribed, if needed.  If narcotic pain medicine is not needed, then you may take acetaminophen (Tylenol) or ibuprofen (Advil) as needed. °2. Take your usually prescribed medications unless otherwise directed °3. If you need a refill on your pain medication, please contact your pharmacy.  They will contact our office to request authorization.  Prescriptions will not be filled after 5pm or on week-ends. °4. You should eat very light the first 24 hours after surgery, such as soup, crackers, pudding, etc.  Resume your normal diet the day after surgery. °5. Most patients will experience some swelling and bruising in the breast.  Ice packs and a good support bra will help.  Swelling and bruising can take several days to resolve.  °6. It is common to experience some constipation if taking pain medication after surgery.  Increasing fluid intake and taking a stool softener will usually help or prevent this problem from occurring.  A mild laxative (Milk of Magnesia or Miralax) should be taken according to package directions if there are no bowel movements after 48 hours. °7. Unless discharge instructions indicate otherwise, you may remove your bandages 24-48 hours after surgery, and you may shower at that time.  You may have steri-strips (small skin tapes) in place directly over the incision.  These strips should be left on the skin for 7-10 days.  If your surgeon used skin glue on the incision, you may shower in 24 hours.  The glue will flake off over the  next 2-3 weeks.  Any sutures or staples will be removed at the office during your follow-up visit. °8. ACTIVITIES:  You may resume regular daily activities (gradually increasing) beginning the next day.  Wearing a good support bra or sports bra minimizes pain and swelling.  You may have sexual intercourse when it is comfortable. °a. You may drive when you no longer are taking prescription pain medication, you can comfortably wear a seatbelt, and you can safely maneuver your car and apply brakes. °b. RETURN TO WORK:  ______________________________________________________________________________________ °9. You should see your doctor in the office for a follow-up appointment approximately two weeks after your surgery.  Your doctor’s nurse will typically make your follow-up appointment when she calls you with your pathology report.  Expect your pathology report 2-3 business days after your surgery.  You may call to check if you do not hear from us after three days. °10. OTHER INSTRUCTIONS: _______________________________________________________________________________________________ _____________________________________________________________________________________________________________________________________ °_____________________________________________________________________________________________________________________________________ °_____________________________________________________________________________________________________________________________________ ° °WHEN TO CALL YOUR DOCTOR: °1. Fever over 101.0 °2. Nausea and/or vomiting. °3. Extreme swelling or bruising. °4. Continued bleeding from incision. °5. Increased pain, redness, or drainage from the incision. ° °The clinic staff is available to answer your questions during regular business hours.  Please don’t hesitate to call and ask to speak to one of the nurses for clinical concerns.  If you have a medical emergency, go to the nearest  emergency room or call 911.  A surgeon from [facility] is always on call at the hospital. ° °For further questions, please visit [email or web address]  ° ° ° °  Post Anesthesia Home Care Instructions ° °Activity: °Get plenty of rest for the remainder of the day. A responsible adult should stay with you for 24 hours following the procedure.  °For the next 24 hours, DO NOT: °-Drive a car °-Operate machinery °-Drink alcoholic beverages °-Take any medication unless instructed by your physician °-Make any legal decisions or sign important papers. ° °Meals: °Start with liquid foods such as gelatin or soup. Progress to regular foods as tolerated. Avoid greasy, spicy, heavy foods. If nausea and/or vomiting occur, drink only clear liquids until the nausea and/or vomiting subsides. Call your physician if vomiting continues. ° °Special Instructions/Symptoms: °Your throat may feel dry or sore from the anesthesia or the breathing tube placed in your throat during surgery. If this causes discomfort, gargle with warm salt water. The discomfort should disappear within 24 hours. ° °If you had a scopolamine patch placed behind your ear for the management of post- operative nausea and/or vomiting: ° °1. The medication in the patch is effective for 72 hours, after which it should be removed.  Wrap patch in a tissue and discard in the trash. Wash hands thoroughly with soap and water. °2. You may remove the patch earlier than 72 hours if you experience unpleasant side effects which may include dry mouth, dizziness or visual disturbances. °3. Avoid touching the patch. Wash your hands with soap and water after contact with the patch. °  ° ° °Call your surgeon if you experience:  ° °1.  Fever over 101.0. °2.  Inability to urinate. °3.  Nausea and/or vomiting. °4.  Extreme swelling or bruising at the surgical site. °5.  Continued bleeding from the incision. °6.  Increased pain, redness or drainage from the incision. °7.   Problems related to your pain medication. °8.  Any problems and/or concerns °

## 2016-09-07 ENCOUNTER — Encounter (HOSPITAL_BASED_OUTPATIENT_CLINIC_OR_DEPARTMENT_OTHER): Payer: Self-pay | Admitting: General Surgery

## 2016-09-15 ENCOUNTER — Ambulatory Visit (HOSPITAL_BASED_OUTPATIENT_CLINIC_OR_DEPARTMENT_OTHER): Payer: BC Managed Care – PPO | Admitting: Oncology

## 2016-09-15 VITALS — BP 138/66 | HR 75 | Temp 98.2°F | Resp 18 | Ht 64.0 in | Wt 197.8 lb

## 2016-09-15 DIAGNOSIS — Z170 Estrogen receptor positive status [ER+]: Secondary | ICD-10-CM | POA: Diagnosis not present

## 2016-09-15 DIAGNOSIS — C50412 Malignant neoplasm of upper-outer quadrant of left female breast: Secondary | ICD-10-CM

## 2016-09-15 DIAGNOSIS — Z79811 Long term (current) use of aromatase inhibitors: Secondary | ICD-10-CM | POA: Diagnosis not present

## 2016-09-15 DIAGNOSIS — D0512 Intraductal carcinoma in situ of left breast: Secondary | ICD-10-CM

## 2016-09-15 NOTE — Progress Notes (Signed)
[place]  [phone] Fax:([phone]) [phone]     ID: Kristin Arroyo DOB: 1951/04/27  MR#: [id number]  ZHY#:[id number]  Patient Care Team: Prince Solian, MD as PCP - General (Internal Medicine) Excell Seltzer, MD as Consulting Physician (General Surgery) Chauncey Cruel, MD as Consulting Physician (Oncology) Gery Pray, MD as Consulting Physician (Radiation Oncology) Azucena Fallen, MD as Consulting Physician (Obstetrics and Gynecology) Valinda Party, MD (Rheumatology) Brion Aliment, RN as Registered Nurse Chauncey Cruel, MD OTHER MD:  CHIEF COMPLAINT: Ductal carcinoma in situ  CURRENT TREATMENT: Adjuvant radiation  BREAST CANCER HISTORY: From the original intake note:  The patient had routine screening mammography November 2017 showing an area of microcalcifications in the left breast. She was referred for diagnostic mammography at the [facility] 06/17/2016 and this showed the breast density to be category B. In the upper-outer quadrant of the left breast there was an area of heterogeneous microcalcifications measuring 0.4 cm.  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  Her subsequent history is as detailed below.  INTERVAL HISTORY: Kristin Arroyo returns today for follow-up of her noninvasive breast cancer accompanied by her daughter. Since her last visit here the patient underwent left lumpectomy, this was on 09/05/2011, and the pathology ([id number]) showed ductal carcinoma in situ, low-grade, measuring 2.2 cm. Margins were negative.  At the original visit I started her on anastrozole. She obtained it for about $5 per month. However after taking the pill for 2 days she developed jabbing pain in both shoulders and her for head. She  stopped it for 2 days and the problem went away. She then took it 1 more day and have unusual pains in other parts of her body so she never did take that medication further.  REVIEW OF SYSTEMS She did well with her surgery, with no unusual pain, bleeding, or fever. She tolerated the anesthesia without any side effects. She felt a little sleepy for about a day and a half postop but otherwise is "back to normal". A detailed review of systems today was otherwise noncontributory  PAST MEDICAL HISTORY: Past Medical History:  Diagnosis Date  . Anxiety   . Arthritis   . Depression   . Diabetes mellitus without complication (HCC)    diet controlled  . GERD (gastroesophageal reflux disease)   . Hypertension   . Respiratory disorder   . Sarcoid ([place])     PAST SURGICAL HISTORY: Past Surgical History:  Procedure Laterality Date  . BREAST LUMPECTOMY WITH RADIOACTIVE SEED LOCALIZATION Left 09/04/2016   Procedure: BREAST LUMPECTOMY WITH RADIOACTIVE SEED LOCALIZATION;  Surgeon: Excell Seltzer, MD;  Location: [place];  Service: General;  Laterality: Left;  . CESAREAN SECTION    . MOUTH SURGERY      FAMILY HISTORY Family History  Problem Relation Age of Onset  . Diabetes Mother   . Hypertension Mother   . Diabetes Father   . Hypertension Father   Patient's father died in his 22s from heart disease. The patient's mother experienced sudden death at age 105. The patient had 2 brothers, 1 sister. There is a family history of melanoma.  GYNECOLOGIC HISTORY:  No LMP recorded. Patient is postmenopausal. Menarche age 47, first live birth age 55. The patient is GX P1. She went through menopause in her early 59s. She did  not take hormone replacement. She used oral contraceptives for some time in the remote past without complications.  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 At home it's just her "and Jesus", with no pets. Her  daughter Kristin Arroyo lives in [place] him a and works in [employer] setting. The patient has no grandchildren. She attends a [employer], medical church.     ADVANCED DIRECTIVES: Not in place   HEALTH MAINTENANCE: Social History  Substance Use Topics  . Smoking status: Former Smoker    Quit date: 07/01/1974  . Smokeless tobacco: Never Used  . Alcohol use Yes     Comment: occ     Colonoscopy:2016/[facility]  PAP: 2017  Bone density: 05/14/2009 at [facility], T score 0.4 (normal)   Allergies  Allergen Reactions  . Remicade [Infliximab] Swelling  . Zithromax [Azithromycin] Hives    Current Outpatient Prescriptions  Medication Sig Dispense Refill  . ADVAIR DISKUS 500-50 MCG/DOSE AEPB Inhale 1 puff into the lungs 2 (two) times daily.   0  . alendronate (FOSAMAX) 70 MG tablet Take 70 mg by mouth once a week. On Wednesdays  1  . anastrozole (ARIMIDEX) 1 MG tablet Take 1 tablet (1 mg total) by mouth daily. 90 tablet 4  . atorvastatin (LIPITOR) 40 MG tablet Take 40 mg by mouth every morning.   1  . cyclobenzaprine (FLEXERIL) 5 MG tablet Take 1 tablet (5 mg total) by mouth 3 (three) times daily as needed. 20 tablet 0  . HYDROcodone-acetaminophen (NORCO/VICODIN) 5-325 MG tablet Take 1-2 tablets by mouth every 4 (four) hours as needed for moderate pain or severe pain. 10 tablet 0  . irbesartan (AVAPRO) 300 MG tablet Take 300 mg by mouth every morning.   1  . methotrexate (RHEUMATREX) 2.5 MG tablet Take 20 mg by mouth once a week. Takes on Thursdays Caution:Chemotherapy. Protect from light.     No current facility-administered medications for this visit.     OBJECTIVE: Middle-aged African-American woman who appears well  Vitals:   09/15/16 1330  BP: 138/66  Pulse: 75  Resp: 18  Temp: 98.2 F (36.8 C)     Body mass index is 33.95 kg/m.    ECOG FS:0 - Asymptomatic  Sclerae unicteric, EOMs intact Oropharynx clear and moist No cervical or supraclavicular  adenopathy Lungs no rales or rhonchi Heart regular rate and rhythm Abd soft, nontender, positive bowel sounds MSK no focal spinal tenderness, no upper extremity lymphedema Neuro: nonfocal, well oriented, appropriate affect Breasts: The right breast is benign. The left breast is status post recent surgery. The cosmetic result is excellent. There is no dehiscence, erythema, or swelling. Both axillae are benign.   LAB RESULTS:  CMP     Component Value Date/Time   NA 143 07/01/2016 0847   K 3.7 07/01/2016 0847   CO2 29 07/01/2016 0847   GLUCOSE 124 07/01/2016 0847   BUN 7.5 07/01/2016 0847   CREATININE 0.7 07/01/2016 0847   CALCIUM 9.1 07/01/2016 0847   PROT 7.1 07/01/2016 0847   ALBUMIN 3.4 (L) 07/01/2016 0847   AST 17 07/01/2016 0847   ALT 17 07/01/2016 0847   ALKPHOS 77 07/01/2016 0847   BILITOT 0.54 07/01/2016 0847    INo results found for: SPEP, UPEP  Lab Results  Component Value Date   WBC 4.5 07/01/2016   NEUTROABS 2.5 07/01/2016   HGB 13.5 09/04/2016   HCT 40.8 07/01/2016   MCV 76.8 (L) 07/01/2016  PLT 177 07/01/2016      Chemistry      Component Value Date/Time   NA 143 07/01/2016 0847   K 3.7 07/01/2016 0847   CO2 29 07/01/2016 0847   BUN 7.5 07/01/2016 0847   CREATININE 0.7 07/01/2016 0847      Component Value Date/Time   CALCIUM 9.1 07/01/2016 0847   ALKPHOS 77 07/01/2016 0847   AST 17 07/01/2016 0847   ALT 17 07/01/2016 0847   BILITOT 0.54 07/01/2016 0847       No results found for: LABCA2  No components found for: LABCA125  No results for input(s): INR in the last 168 hours.  Urinalysis No results found for: COLORURINE, APPEARANCEUR, LABSPEC, PHURINE, GLUCOSEU, HGBUR, BILIRUBINUR, KETONESUR, PROTEINUR, UROBILINOGEN, NITRITE, LEUKOCYTESUR   STUDIES: Mm Breast Surgical Specimen  Result Date: 09/04/2016 CLINICAL DATA:  Biopsy-proven DCIS involving the upper outer left breast. Radioactive seed localization was performed yesterday.  Lumpectomy is performed today. EXAM: SPECIMEN RADIOGRAPH OF THE LEFT BREAST COMPARISON:  Previous exam(s). FINDINGS: Status post excision of the left breast. The radioactive seed and X shaped biopsy marker clip are present, completely intact, and are marked for pathology. This was discussed by telephone with the operating room nurse at the time of interpretation on 09/04/2016 at 10:47 a.m. IMPRESSION: Specimen radiograph of the left breast. Electronically Signed   By: Evangeline Dakin M.D.   On: 09/04/2016 10:49   Mm Lt Radioactive Seed Loc Mammo Guide  Result Date: 09/03/2016 CLINICAL DATA:  Left breast cancer for radioactive seed placement prior to left breast surgery. EXAM: MAMMOGRAPHIC GUIDED RADIOACTIVE SEED LOCALIZATION OF THE LEFT BREAST COMPARISON:  Previous exam(s). FINDINGS: Patient presents for radioactive seed localization prior to left breast surgery. I met with the patient and we discussed the procedure of seed localization including benefits and alternatives. We discussed the high likelihood of a successful procedure. We discussed the risks of the procedure including infection, bleeding, tissue injury and further surgery. We discussed the low dose of radioactivity involved in the procedure. Informed, written consent was given. The usual time-out protocol was performed immediately prior to the procedure. Using mammographic guidance, sterile technique, 1% lidocaine and an I-125 radioactive seed, biopsy clip was localized using a cranial approach. The follow-up mammogram images confirm the seed in the expected location and were marked for Dr. Excell Seltzer. Follow-up survey of the patient confirms presence of the radioactive seed. Order number of I-125 seed:  [id number]. Total activity:  4.196 millicuries  Reference Date: August 07, 2016 The patient tolerated the procedure well and was released from the [facility]. She was given instructions regarding seed removal. IMPRESSION: Radioactive seed  localization left breast. No apparent complications. Electronically Signed   By: Abelardo Diesel M.D.   On: 09/03/2016 13:56    ELIGIBLE FOR AVAILABLE RESEARCH PROTOCOL:  Decided against COMET  trial  ASSESSMENT: 66 y.o. [place] woman status post left breast upper outer quadrant biopsy 06/17/2016 for ductal carcinoma in situ, grade 2, estrogen and progesterone receptor positive  (1)  status post left lumpectomy 09/05/2011 for low-grade ductal carcinoma in situ, with negative margins.   (2) started anastrozole 08/05/2016, discontinued after 3 days with unusual symptoms, which immediately resolved   (3) adjuvant radiation pending  (4) to reconsider anti-estrogens  PLAN: Kristin Arroyo did well with her surgery. She had decided she would have no radiation and that she would not take anti-estrogens. However after we discussed her situation today she is changing her mind.  She understands that even though  her breast cancer was noninvasive, and cannot travel" to other parts of her body, it can recur in her breast, and sometimes when it recurs in the breast that is invasive and requires more intensive treatment.  She understands that radiation will reduce the risk of that happening by more than half. We also discussed the possible toxicities, side effects and complications of radiation.  After that discussion she is willing to consider radiation and we are making her an appointment with Dr. Sondra Come to discuss that further.  We then turned to anastrozole. It is possible that she would've had the symptoms she described but it is unusual to have them so soon after starting anastrozole. She understands that anastrozole will further reduce the risk of local recurrence and also cut in half the risk of her developing another breast cancer in the future.  After this discussion she was willing to give anastrozole a try. We decided she would take one anastrozole pill today next week she will take 1 on Tuesday and  Thursday. The following week she would take it on Monday Wednesday and Friday. If she tolerates that well she will go back to daily and she will call if she has any problems with that medication. She understands that we do have other options that she can try if this 1 is not well tolerated.  I will see her again in approximately 6 weeks. Once she is on a stable anti-estrogen, or has definitively decided not to take anti-estrogens, we will initiate yearly follow-up.  Chauncey Cruel, MD   09/15/2016 1:33 PM Medical Oncology and Hematology [facility] [address] [place], [place] [address] Tel. [phone]    Fax. [phone]  Allergen always sinus

## 2016-09-16 ENCOUNTER — Encounter: Payer: Self-pay | Admitting: Radiation Oncology

## 2016-09-23 ENCOUNTER — Ambulatory Visit
Admission: RE | Admit: 2016-09-23 | Discharge: 2016-09-23 | Disposition: A | Discharge status: Home / Self Care | Payer: BC Managed Care – PPO | Source: Ambulatory Visit | Attending: Radiation Oncology | Admitting: Radiation Oncology

## 2016-09-23 ENCOUNTER — Encounter: Payer: Self-pay | Admitting: Radiation Oncology

## 2016-09-23 ENCOUNTER — Telehealth: Payer: Self-pay | Admitting: *Deleted

## 2016-09-23 VITALS — BP 158/64 | HR 67 | Temp 98.6°F | Ht 64.0 in | Wt 203.2 lb

## 2016-09-23 DIAGNOSIS — Z170 Estrogen receptor positive status [ER+]: Principal | ICD-10-CM

## 2016-09-23 DIAGNOSIS — C50412 Malignant neoplasm of upper-outer quadrant of left female breast: Secondary | ICD-10-CM

## 2016-09-23 DIAGNOSIS — Z79899 Other long term (current) drug therapy: Secondary | ICD-10-CM | POA: Insufficient documentation

## 2016-09-23 HISTORY — DX: Malignant neoplasm of unspecified site of unspecified female breast: C50.919

## 2016-09-23 HISTORY — DX: Rheumatoid arthritis, unspecified: M06.9

## 2016-09-23 NOTE — Progress Notes (Signed)
Please see the Nurse Progress Note in the MD Initial Consult Encounter for this patient. 

## 2016-09-23 NOTE — Progress Notes (Signed)
Location of Breast Cancer: upper outer quadrant left breast    Histology per Pathology Report:   09/04/16 Diagnosis Breast, lumpectomy, Left - DUCTAL CARCINOMA IN SITU WITH CALCIFICATIONS, LOW GRADE, SPANNING 2.2 CM. - THE SURGICAL RESECTION MARGINS ARE NEGATIVE FOR CARCINOMA. - SEE ONCOLOGY TABLE BELOW.  06/23/16 Diagnosis Breast, left, needle core biopsy, upper outer - DUCTAL CARCINOMA IN SITU WITH ASSOCIATED CALCIFICATIONS. - SEE COMMENT  Receptor Status: ER(100%), PR (100%)  Did patient present with symptoms (if so, please note symptoms) or was this found on screening mammography?: screening mammogram  Past/Anticipated interventions by surgeon, if any:  09/04/16 - Procedure: BREAST LUMPECTOMY WITH RADIOACTIVE SEED LOCALIZATION;  Surgeon: Excell Seltzer, MD  Past/Anticipated interventions by medical oncology, if any: anastrozole   Lymphedema issues, if any:  no   Pain issues, if any:  no   SAFETY ISSUES:  Prior radiation? no  Pacemaker/ICD? no  Possible current pregnancy?no  Is the patient on methotrexate? yes  Current Complaints / other details:  Patient is questioning whether she needs radiation and anastrozole.  She is also concerned about side effects.    BP (!) 158/64 (BP Location: Right Arm, Patient Position: Sitting)   Pulse 67   Temp 98.6 F (37 C) (Oral)   Ht 5\' 4"  (1.626 m)   Wt 203 lb 3.2 oz (92.2 kg)   SpO2 98%   BMI 34.88 kg/m    Wt Readings from Last 3 Encounters:  09/23/16 203 lb 3.2 oz (92.2 kg)  09/15/16 197 lb 12.8 oz (89.7 kg)  09/04/16 196 lb (88.9 kg)      Jacqulyn Liner, RN 09/23/2016,8:16 AM

## 2016-09-23 NOTE — Telephone Encounter (Signed)
"  I need to know who I am seeing today at 3:00 pm and where do I go?"  Advised she will meet Radiation Oncology nurse for assessment then followed by appointment to meet Radiation Oncologist Dr. Sondra Come.  No further questions.

## 2016-09-23 NOTE — Progress Notes (Signed)
Radiation Oncology         [phone] ________________________________  Name: Kristin Arroyo MRN: [id number]  Date: 09/23/2016  DOB: 01/25/1951  Re-consultation Note  CC: Tivis Ringer, MD  Magrinat, Virgie Dad, MD    ICD-9-CM ICD-10-CM   1. Malignant neoplasm of upper-outer quadrant of left breast in female, estrogen receptor positive ([place]) 174.4 C50.412    V86.0 Z17.0     Diagnosis:   Clinical Stage 0 Left Breast UOQ Ductal Carcinoma In Situ with Calcifications, ER+ / PR+, Grade 1  Narrative:  The patient returns today for re-consultation after breast clinic on 07/01/16. Since clinic, the patient underwent a left lumpectomy on 09/04/16. 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 The margins were negative. She was started on Anastrozole on 08/05/16. Patient was seen by Dr. Jana Hakim on 09/15/16. She revealed to Dr. Jana Hakim she developed a jabbing pain in bilateral shoulders and head following the start of the medication. She decided to stop taking the medication 3 days later and noted the symptoms resolved. Dr. Jana Hakim advised the patient to start by taking 1 pill following her visit, then 1 pill on Tuesday and Thursday of the following week, then 1 pill on Monday, Wednesday, and Friday of the following week. If the patient is able to tolerate that treatment, she can return to taking Anastrozole daily. She will follow up with Dr. Jana Hakim in 6 weeks. She presents to clinic today to discuss the role of radiation in disease management.       Patient denies lymphedema or pain at this time.                     ALLERGIES:  is allergic to remicade [infliximab] and zithromax [azithromycin].  Meds: Current Outpatient Prescriptions  Medication Sig Dispense Refill  . ADVAIR DISKUS 500-50 MCG/DOSE AEPB Inhale 1 puff into the lungs 2 (two) times daily.   0  . alendronate (FOSAMAX) 70 MG tablet Take 70 mg by mouth once a week. On Wednesdays  1  . anastrozole (ARIMIDEX) 1  MG tablet Take 1 tablet (1 mg total) by mouth daily. 90 tablet 4  . atorvastatin (LIPITOR) 40 MG tablet Take 40 mg by mouth every morning.   1  . cyclobenzaprine (FLEXERIL) 5 MG tablet Take 1 tablet (5 mg total) by mouth 3 (three) times daily as needed. 20 tablet 0  . irbesartan (AVAPRO) 300 MG tablet Take 300 mg by mouth every morning.   1  . methotrexate (RHEUMATREX) 2.5 MG tablet Take 20 mg by mouth once a week. Takes on Thursdays Caution:Chemotherapy. Protect from light.     No current facility-administered medications for this encounter.     Physical Findings: The patient is in no acute distress. Patient is alert and oriented.  height is _0  (1.626 m) and weight is 203 lb 3.2 oz (92.2 kg). Her oral temperature is 98.6 F (37 C). Her blood pressure is 158/64 (abnormal) and her pulse is 67. Her oxygen saturation is 98%. .  No significant changes. Bilateral expiratory wheezing noted during lung exam. Heart has regular rate and rhythm. No palpable cervical, supraclavicular, or axillary adenopathy. Abdomen soft, non-tender, normal bowel sounds. Well-healing scar noted in the left periareolar region with no signs of drainage or infection. There appears to be a 3-4 cm seroma under the lumpectomy scar in the left breast.   Lab Findings: Lab Results  Component Value Date   WBC 4.5 07/01/2016   HGB 13.5  09/04/2016   HCT 40.8 07/01/2016   MCV 76.8 (L) 07/01/2016   PLT 177 07/01/2016    Radiographic Findings: Mm Breast Surgical Specimen  Result Date: 09/04/2016 CLINICAL DATA:  Biopsy-proven DCIS involving the upper outer left breast. Radioactive seed localization was performed yesterday. Lumpectomy is performed today. EXAM: SPECIMEN RADIOGRAPH OF THE LEFT BREAST COMPARISON:  Previous exam(s). FINDINGS: Status post excision of the left breast. The radioactive seed and X shaped biopsy marker clip are present, completely intact, and are marked for pathology. This was discussed by telephone with  the operating room nurse at the time of interpretation on 09/04/2016 at 10:47 a.m. IMPRESSION: Specimen radiograph of the left breast. Electronically Signed   By: Evangeline Dakin M.D.   On: 09/04/2016 10:49   Mm Lt Radioactive Seed Loc Mammo Guide  Result Date: 09/03/2016 CLINICAL DATA:  Left breast cancer for radioactive seed placement prior to left breast surgery. EXAM: MAMMOGRAPHIC GUIDED RADIOACTIVE SEED LOCALIZATION OF THE LEFT BREAST COMPARISON:  Previous exam(s). FINDINGS: Patient presents for radioactive seed localization prior to left breast surgery. I met with the patient and we discussed the procedure of seed localization including benefits and alternatives. We discussed the high likelihood of a successful procedure. We discussed the risks of the procedure including infection, bleeding, tissue injury and further surgery. We discussed the low dose of radioactivity involved in the procedure. Informed, written consent was given. The usual time-out protocol was performed immediately prior to the procedure. Using mammographic guidance, sterile technique, 1% lidocaine and an I-125 radioactive seed, biopsy clip was localized using a cranial approach. The follow-up mammogram images confirm the seed in the expected location and were marked for Dr. Excell Seltzer. Follow-up survey of the patient confirms presence of the radioactive seed. Order number of I-125 seed:  [id number]. Total activity:  1.761 millicuries  Reference Date: August 07, 2016 The patient tolerated the procedure well and was released from the [facility]. She was given instructions regarding seed removal. IMPRESSION: Radioactive seed localization left breast. No apparent complications. Electronically Signed   By: Abelardo Diesel M.D.   On: 09/03/2016 13:56    Impression: Clinical Stage 0 Left Breast UOQ Ductal Carcinoma In Situ with Calcifications, ER+ / PR+, Grade 2.  The patient has started adjuvant hormonal therapy. She originally stopped due  to side effects, but has recently restarted at a decreased interval. We discussed the role of adjuvant radiation based on the size of the tumor. I recommended we proceed with oncotype DX DCIS testing to determine the risk of recurrence. The patient would like to proceed with this test and she will make a decision on radiation once the study is complete.   Plan:  Order oncotype DX  DCIS testing to determine the patient's risk. If the patient is determined to have high risk, I would recommend to proceed with radiation therapy. If the patient is determined to have low risk, I would be comfortable with her declining further treatment. Patient will make a decision after the results of the study are complete.  ____________________________________   This document serves as a record of services personally performed by Gery Pray, MD. It was created on his behalf by Bethann Humble, a trained medical scribe. The creation of this record is based on the scribe's personal observations and the provider's statements to them. This document has been checked and approved by the attending provider.

## 2016-09-24 ENCOUNTER — Telehealth: Payer: Self-pay | Admitting: *Deleted

## 2016-09-24 NOTE — Telephone Encounter (Signed)
Received order for oncotype testing for DCIS. Requisition sent to pathology. Received by Varney Biles. PAC sent to [facility]

## 2016-10-01 ENCOUNTER — Encounter (HOSPITAL_COMMUNITY): Payer: Self-pay

## 2016-10-01 ENCOUNTER — Telehealth: Payer: Self-pay | Admitting: *Deleted

## 2016-10-01 NOTE — Telephone Encounter (Signed)
Received oncotype score for DCIS of 7. Physician team notified.

## 2016-10-19 ENCOUNTER — Ambulatory Visit
Admission: RE | Admit: 2016-10-19 | Discharge: 2016-10-19 | Disposition: A | Discharge status: Home / Self Care | Payer: BC Managed Care – PPO | Source: Ambulatory Visit | Attending: Radiation Oncology | Admitting: Radiation Oncology

## 2016-10-19 ENCOUNTER — Encounter: Payer: Self-pay | Admitting: Radiation Oncology

## 2016-10-19 VITALS — BP 152/78 | HR 69 | Temp 98.1°F | Resp 20 | Wt 200.4 lb

## 2016-10-19 DIAGNOSIS — C50412 Malignant neoplasm of upper-outer quadrant of left female breast: Secondary | ICD-10-CM

## 2016-10-19 DIAGNOSIS — Z170 Estrogen receptor positive status [ER+]: Principal | ICD-10-CM

## 2016-10-19 NOTE — Progress Notes (Signed)
Radiation Oncology         [phone] ________________________________  Name: Kristin Arroyo MRN: [id number]  Date: 10/19/2016  DOB: 1951-05-03  Re-Evaluation Note  CC: Tivis Ringer, MD  Magrinat, Virgie Dad, MD    ICD-9-CM ICD-10-CM   1. 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 Since clinic, the patient underwent a left lumpectomy on 09/04/16. 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 The margins were negative.  She was started on Anastrozole on 08/05/16 and the patient was seen by Dr. Jana Hakim on 09/15/16. She revealed to Dr. Jana Hakim she developed a jabbing pain in bilateral shoulders and head following the start of the medication. She decided to stop taking the medication 3 days later and noted the symptoms resolved. Dr. Jana Hakim advised the patient to start by taking 1 pill following her visit, then 1 pill on Tuesday and Thursday of the following week, then 1 pill on Monday, Wednesday, and Friday of the following week. If the patient was able to tolerate that treatment, she could return to taking Anastrozole daily. She is now tolerating this medication well.  Oncotype DX DCIS testing was ordered determine the patient's risk of recurrence. If the patient was determined to have high risk, I would recommend to proceed with radiation therapy. If the patient was determined to have low risk, then I would be comfortable with her declining further treatment.  The patient's ER Score was 10.7 Positive and PR Score was 8.1 Positive. The patient's Breast DCIS Score result was 7, indicating a 8% risk of tumor recurrence in the same breast in 10 years if treated with only breast-conserving surgery.  The patient and her  daughter present today to discuss her breast report.  ALLERGIES:  is allergic to remicade [infliximab] and zithromax [azithromycin].  Meds: Current Outpatient Prescriptions  Medication Sig Dispense Refill  . ADVAIR DISKUS 500-50 MCG/DOSE AEPB Inhale 1 puff into the lungs 2 (two) times daily.   0  . alendronate (FOSAMAX) 70 MG tablet Take 70 mg by mouth once a week. On Wednesdays  1  . anastrozole (ARIMIDEX) 1 MG tablet Take 1 tablet (1 mg total) by mouth daily. 90 tablet 4  . atorvastatin (LIPITOR) 40 MG tablet Take 40 mg by mouth every morning.   1  . cyclobenzaprine (FLEXERIL) 5 MG tablet Take 1 tablet (5 mg total) by mouth 3 (three) times daily as needed. 20 tablet 0  . irbesartan (AVAPRO) 300 MG tablet Take 300 mg by mouth every morning.   1  . methotrexate (RHEUMATREX) 2.5 MG tablet Take 20 mg by mouth once a week. Takes on Thursdays Caution:Chemotherapy. Protect from light.     No current facility-administered medications for this encounter.     Physical Findings: The patient is in no acute distress. Patient is alert and oriented.  weight is 200 lb 6.4 oz (90.9 kg). Her oral temperature is 98.1 F (36.7 C). Her blood pressure is 152/78 (abnormal) and her pulse is 69. Her respiration is 20 and oxygen saturation is 100%.   Lungs are clear to auscultation bilaterally. Heart has regular rate and rhythm. Breasts were not examined during this encounter.  Lab Findings: Lab Results  Component Value Date  WBC 4.5 07/01/2016   HGB 13.5 09/04/2016   HCT 40.8 07/01/2016   MCV 76.8 (L) 07/01/2016   PLT 177 07/01/2016    Radiographic Findings: No results found.  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 If the patient continues to take an AI, then radiation therapy would likely give  a minimal benefit to reduce her risk of recurrence.  Plan: After discussing the patient's Oncotype DX testing, I do not recommend adjuvant radiation to further reduce her risk of recurrence since it is already low and she is willing to an an aromatase inhibitory. Patient agrees with this treatment approach . The patient will follow up with radiation oncology on a PRN basis and she will continue close follow up with Dr. Jana Hakim. ____________________________________ -----------------------------------  Blair Promise, PhD, MD  This document serves as a record of services personally performed by Gery Pray, MD. It was created on his behalf by Darcus Austin, a trained medical scribe. The creation of this record is based on the scribe's personal observations and the provider's statements to them. This document has been checked and approved by the attending provider.

## 2016-10-19 NOTE — Progress Notes (Addendum)
Kristin Arroyo is here today to discuss genetic results and determine next steps for radiation.  She questions is she should be taking methotrexate and anastrozole at the same time as she currently is.  She is concerned about side effects of radiation.  Patient denies any pain.  Patient denies any issues with appetite.  Denies any issues with fatigue.     Vitals:   10/19/16 1119  BP: (!) 152/78  Pulse: 69  Resp: 20  Temp: 98.1 F (36.7 C)  TempSrc: Oral  SpO2: 100%  Weight: 200 lb 6.4 oz (90.9 kg)    Wt Readings from Last 3 Encounters:  10/19/16 200 lb 6.4 oz (90.9 kg)  09/23/16 203 lb 3.2 oz (92.2 kg)  09/15/16 197 lb 12.8 oz (89.7 kg)

## 2016-11-05 ENCOUNTER — Telehealth: Payer: Self-pay | Admitting: Oncology

## 2016-11-05 NOTE — Telephone Encounter (Signed)
Pt called to r/s 4/30 appt to 5/9 at 2 pm

## 2016-11-09 ENCOUNTER — Ambulatory Visit: Payer: BC Managed Care – PPO | Admitting: Oncology

## 2016-11-18 ENCOUNTER — Ambulatory Visit (HOSPITAL_BASED_OUTPATIENT_CLINIC_OR_DEPARTMENT_OTHER): Payer: BC Managed Care – PPO | Admitting: Oncology

## 2016-11-18 VITALS — BP 156/69 | HR 61 | Temp 98.0°F | Ht 64.0 in | Wt 200.7 lb

## 2016-11-18 DIAGNOSIS — Z79811 Long term (current) use of aromatase inhibitors: Secondary | ICD-10-CM

## 2016-11-18 DIAGNOSIS — D0512 Intraductal carcinoma in situ of left breast: Secondary | ICD-10-CM | POA: Diagnosis not present

## 2016-11-18 DIAGNOSIS — Z170 Estrogen receptor positive status [ER+]: Secondary | ICD-10-CM | POA: Diagnosis not present

## 2016-11-18 DIAGNOSIS — C50412 Malignant neoplasm of upper-outer quadrant of left female breast: Secondary | ICD-10-CM

## 2016-11-18 NOTE — Progress Notes (Signed)
Kristin Arroyo  [phone]) [phone] Fax:(336) [phone]     ID: Kristin Arroyo DOB: [birth year]  MR#: [id number]  LPF#:[id number]  Patient Care Team: Kristin Solian, MD as PCP - General (Internal Medicine) Kristin Seltzer, MD as Consulting Physician (General Surgery) Kristin Arroyo, Kristin Dad, MD as Consulting Physician (Oncology) Kristin Pray, MD as Consulting Physician (Radiation Oncology) Kristin Fallen, MD as Consulting Physician (Obstetrics and Gynecology) Kristin Party, MD (Rheumatology) Kristin Aliment, RN as Registered Nurse Kristin Cruel, MD OTHER MD:  CHIEF COMPLAINT: Ductal carcinoma in situ  CURRENT TREATMENT: Anastrozole  BREAST CANCER HISTORY: From the original intake note:  The patient had routine screening mammography November 2017 showing an area of microcalcifications in the left breast. She was referred for diagnostic mammography at the [facility] 06/17/2016 and this showed the breast density to be category B. In the upper-outer quadrant of the left breast there was an area of heterogeneous microcalcifications measuring 0.4 cm.  On 06/23/2016 she underwent upper outer quadrant left breast biopsy of the microcalcifications in question and this showed ([id number]) ductal carcinoma in situ, grade 2, estrogen and progesterone receptor both 100% positive with strong staining intensity.  Her subsequent history is as detailed below.  INTERVAL HISTORY: Kristin Arroyo returns today for follow-up of her estrogen receptor positive ductal carcinoma in situ. She did give the anastrozole a try, starting with just one pill a week, then pill twice a week and so on and "my system adjusted". She is now taking it daily with essentially no side effects. Occasional she will have a hot flash. Vaginal dryness is not an issue. She pays approximately $5 a month for this medication  She met with radiation oncology and Dr. Sondra Arroyo to discuss her risk of recurrence without any  other treatment than surgery, which he calculated at about 8%. If she takes anastrozole of course that is cut in about in half so the benefit of radiation from that point would be minuscule accordingly she decided against radiation and she understands that I am comfortable with that decision.  REVIEW OF SYSTEMS She has occasional difficulty walking up stairs, but otherwise no significant side effects to report today  PAST MEDICAL HISTORY: Past Medical History:  Diagnosis Date  . Anxiety   . Breast cancer ([place])    left breast  . Depression   . Diabetes mellitus without complication ([facility])    diet controlled  . GERD (gastroesophageal reflux disease)   . Hypertension   . Respiratory disorder    occasional bronchitis  . Rheumatoid arthritis ([place])   . Sarcoid     PAST SURGICAL HISTORY: Past Surgical History:  Procedure Laterality Date  . BREAST LUMPECTOMY WITH RADIOACTIVE SEED LOCALIZATION Left 09/04/2016   Procedure: BREAST LUMPECTOMY WITH RADIOACTIVE SEED LOCALIZATION;  Surgeon: Kristin Seltzer, MD;  Location: [facility];  Service: General;  Laterality: Left;  . CESAREAN SECTION    . LYMPH NODE BIOPSY     before 1976 by her neck to help diagnos sarcoidosis  . MOUTH SURGERY      FAMILY HISTORY Family History  Problem Relation Age of Onset  . Diabetes Mother   . Hypertension Mother   . Diabetes Father   . Hypertension Father   Patient's father died in his 44s from heart disease. The patient's mother experienced sudden death at age 31. The patient had 2 brothers, 1 sister. There is a family history of melanoma.  GYNECOLOGIC HISTORY:  No LMP recorded. Patient is postmenopausal. Menarche age 39,  first live birth age 65. The patient is GX P1. She went through menopause in her early 74s. She did not take hormone replacement. She used oral contraceptives for some time in the remote past without complications.  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 At home it's just her "and Jesus", with no pets. Her daughter Kristin Arroyo lives in [place] him a and works in [employer] setting. The patient has no grandchildren. She attends a [employer] medical church.     ADVANCED DIRECTIVES: Not in place   HEALTH MAINTENANCE: Social History  Substance Use Topics  . Smoking status: Former Smoker    Quit date: 07/01/1974  . Smokeless tobacco: Never Used  . Alcohol use Yes     Comment: occ     Colonoscopy:2016/[facility]  PAP: 2017  Bone density: 05/14/2009 at [facility], T score 0.4 (normal)   Allergies  Allergen Reactions  . Remicade [Infliximab] Swelling  . Zithromax [Azithromycin] Hives    Previously had issue but last dose did not have any issues    Current Outpatient Prescriptions  Medication Sig Dispense Refill  . ADVAIR DISKUS 500-50 MCG/DOSE AEPB Inhale 1 puff into the lungs 2 (two) times daily.   0  . alendronate (FOSAMAX) 70 MG tablet Take 70 mg by mouth once a week. On Wednesdays  1  . anastrozole (ARIMIDEX) 1 MG tablet Take 1 tablet (1 mg total) by mouth daily. 90 tablet 4  . atorvastatin (LIPITOR) 40 MG tablet Take 40 mg by mouth every morning.   1  . cyclobenzaprine (FLEXERIL) 5 MG tablet Take 1 tablet (5 mg total) by mouth 3 (three) times daily as needed. 20 tablet 0  . irbesartan (AVAPRO) 300 MG tablet Take 300 mg by mouth every morning.   1  . methotrexate (RHEUMATREX) 2.5 MG tablet Take 20 mg by mouth once a week. Takes on Thursdays Caution:Chemotherapy. Protect from light.     No current facility-administered medications for this visit.     OBJECTIVE: Middle-aged African-American woman In no acute distress  Vitals:   11/18/16 1430  BP: (!) 156/69  Pulse: 61  Temp: 98 F (36.7 C)     Body mass index is 34.45 kg/m.    ECOG FS:0 - Asymptomatic  Sclerae unicteric, pupils round and equal Oropharynx clear and moist No cervical or supraclavicular  adenopathy Lungs no rales or rhonchi Heart regular rate and rhythm Abd soft, obese, nontender, positive bowel sounds MSK no focal spinal tenderness, no upper extremity lymphedema Neuro: nonfocal, well oriented, appropriate affect Breasts: The right breast is unremarkable. The left breast is status post lumpectomy. The cosmetic result is generally good aside from a slight dimple superior to the nipple. There are no palpable masses. Both axillae are benign.  LAB RESULTS:  CMP     Component Value Date/Time   NA 143 07/01/2016 0847   K 3.7 07/01/2016 0847   CO2 29 07/01/2016 0847   GLUCOSE 124 07/01/2016 0847   BUN 7.5 07/01/2016 0847   CREATININE 0.7 07/01/2016 0847   CALCIUM 9.1 07/01/2016 0847   PROT 7.1 07/01/2016 0847   ALBUMIN 3.4 (L) 07/01/2016 0847   AST 17 07/01/2016 0847   ALT 17 07/01/2016 0847   ALKPHOS 77 07/01/2016 0847   BILITOT 0.54 07/01/2016 0847    INo results found for: SPEP, UPEP  Lab Results  Component Value Date   WBC 4.5 07/01/2016   NEUTROABS 2.5 07/01/2016  HGB 13.5 09/04/2016   HCT 40.8 07/01/2016   MCV 76.8 (L) 07/01/2016   PLT 177 07/01/2016      Chemistry      Component Value Date/Time   NA 143 07/01/2016 0847   K 3.7 07/01/2016 0847   CO2 29 07/01/2016 0847   BUN 7.5 07/01/2016 0847   CREATININE 0.7 07/01/2016 0847      Component Value Date/Time   CALCIUM 9.1 07/01/2016 0847   ALKPHOS 77 07/01/2016 0847   AST 17 07/01/2016 0847   ALT 17 07/01/2016 0847   BILITOT 0.54 07/01/2016 0847       No results found for: LABCA2  No components found for: LABCA125  No results for input(s): INR in the last 168 hours.  Urinalysis No results found for: COLORURINE, APPEARANCEUR, LABSPEC, PHURINE, GLUCOSEU, HGBUR, BILIRUBINUR, KETONESUR, PROTEINUR, UROBILINOGEN, NITRITE, LEUKOCYTESUR   STUDIES: She will be due for repeat mammography February 2019.  ELIGIBLE FOR AVAILABLE RESEARCH PROTOCOL:  Decided against COMET  trial  ASSESSMENT:  66 y.o. [place] woman status post left breast upper outer quadrant biopsy 06/17/2016 for ductal carcinoma in situ, grade 2, estrogen and progesterone receptor positive  (1)  status post left lumpectomy 09/04/2016 for low-grade ductal carcinoma in situ, with negative margins.   (2) started anastrozole 08/05/2016, discontinued after 3 days with unusual symptoms, which immediately resolved   (3) adjuvant radiation felt to only marginally improve her risk of local recurrence assuming she took anti-estrogens, with no effect on survival: patient opted against it  (4) resumed anastrozole March 2018  PLAN: Macee is tolerating anastrozole remarkably well and the plan will be to continue that for a total of 5 years.  She has not had a bone density as far she knows and we are going to obtain one when she has her next mammogram February 2019.  After being so reluctant to even consider radiation now she is worried that she did not receive it. I reassured her this was not a survival issue but a recurrence issue and that the benefit of radiation would've been in a handful of percentage points, and again with no survival advantage. She felt more comfortable after that  I suggested she consider the "finding your new normal" group.  She will see me again in a year. She knows to call for any problems that may develop before that visit.  Kristin Cruel, MD   11/18/2016 2:33 PM Medical Oncology and Hematology [facility] [address] [place], [place] [address] Tel. [phone]    Fax. [phone]  Allergen always sinus

## 2016-11-19 ENCOUNTER — Encounter: Payer: Self-pay | Admitting: *Deleted

## 2017-01-15 ENCOUNTER — Other Ambulatory Visit: Payer: Self-pay | Admitting: Obstetrics & Gynecology

## 2017-01-15 NOTE — Patient Instructions (Addendum)
Your procedure is scheduled on:  Friday, January 22, 2017  Enter through the Main Entrance of [facility] at:  6:00 AM  Pick up the phone at the desk and dial [phone].  Call this number if you have problems the morning of surgery: [phone].  Remember: Do NOT eat food or drink after:  Midnight Thursday  Take these medicines the morning of surgery with a SIP OF WATER:  Anastrozole, Atorvastatin, Irbesartan, Use inhaler per normal routine  Bring Asthma Inhaler day of surgery  Stop ALL herbal medications at this time  Do NOT smoke the day of surgery.  Do NOT wear jewelry (body piercing), metal hair clips/bobby pins, make-up, artifical eyelashes or nail polish. Do NOT wear lotions, powders, or perfumes.  You may wear deodorant. Do NOT shave for 48 hours prior to surgery. Do NOT bring valuables to the hospital. Contacts, dentures, or bridgework may not be worn into surgery.  Have a responsible adult drive you home and stay with you for 24 hours after your procedure  Bring a copy of your healthcare power of attorney and living will documents.

## 2017-01-18 ENCOUNTER — Other Ambulatory Visit: Payer: Self-pay

## 2017-01-18 ENCOUNTER — Encounter (HOSPITAL_COMMUNITY)
Admission: RE | Admit: 2017-01-18 | Discharge: 2017-01-18 | Disposition: A | Discharge status: Home / Self Care | Payer: BC Managed Care – PPO | Source: Ambulatory Visit | Attending: Obstetrics & Gynecology | Admitting: Obstetrics & Gynecology

## 2017-01-18 ENCOUNTER — Encounter (HOSPITAL_COMMUNITY): Payer: Self-pay

## 2017-01-18 ENCOUNTER — Encounter: Payer: Self-pay | Admitting: Adult Health

## 2017-01-18 ENCOUNTER — Ambulatory Visit (HOSPITAL_BASED_OUTPATIENT_CLINIC_OR_DEPARTMENT_OTHER): Payer: BC Managed Care – PPO | Admitting: Adult Health

## 2017-01-18 VITALS — BP 161/71 | HR 63 | Temp 98.3°F | Resp 17 | Ht 64.0 in | Wt 204.9 lb

## 2017-01-18 DIAGNOSIS — Z170 Estrogen receptor positive status [ER+]: Secondary | ICD-10-CM | POA: Diagnosis not present

## 2017-01-18 DIAGNOSIS — C50412 Malignant neoplasm of upper-outer quadrant of left female breast: Secondary | ICD-10-CM

## 2017-01-18 DIAGNOSIS — D0512 Intraductal carcinoma in situ of left breast: Secondary | ICD-10-CM

## 2017-01-18 DIAGNOSIS — Z01818 Encounter for other preprocedural examination: Secondary | ICD-10-CM | POA: Insufficient documentation

## 2017-01-18 DIAGNOSIS — Z79811 Long term (current) use of aromatase inhibitors: Secondary | ICD-10-CM

## 2017-01-18 HISTORY — DX: Other muscle spasm: M62.838

## 2017-01-18 HISTORY — DX: Anemia, unspecified: D64.9

## 2017-01-18 LAB — CBC
HEMATOCRIT: 40.8 % (ref 36.0–46.0)
HEMOGLOBIN: 12.8 g/dL (ref 12.0–15.0)
MCH: 24.5 pg — AB (ref 26.0–34.0)
MCHC: 31.4 g/dL (ref 30.0–36.0)
MCV: 78 fL (ref 78.0–100.0)
Platelets: 196 10*3/uL (ref 150–400)
RBC: 5.23 MIL/uL — AB (ref 3.87–5.11)
RDW: 16.4 % — ABNORMAL HIGH (ref 11.5–15.5)
WBC: 5.4 10*3/uL (ref 4.0–10.5)

## 2017-01-18 LAB — BASIC METABOLIC PANEL
ANION GAP: 7 (ref 5–15)
BUN: 14 mg/dL (ref 6–20)
CALCIUM: 9.7 mg/dL (ref 8.9–10.3)
CHLORIDE: 105 mmol/L (ref 101–111)
CO2: 27 mmol/L (ref 22–32)
Creatinine, Ser: 0.9 mg/dL (ref 0.44–1.00)
GFR calc non Af Amer: >60 mL/min (ref >60)
GLUCOSE: 128 mg/dL — AB (ref 65–99)
POTASSIUM: 4.1 mmol/L (ref 3.5–5.1)
Sodium: 139 mmol/L (ref 135–145)

## 2017-01-18 NOTE — Progress Notes (Signed)
CLINIC:  Survivorship   REASON FOR VISIT:  Routine follow-up post-treatment for a recent history of breast cancer.  BRIEF ONCOLOGIC HISTORY:    Malignant neoplasm of upper-outer quadrant of left female breast ([place])   06/23/2016 Initial Biopsy    Left breast needle core biopsy, upper outer: DCIS with calcs, intermediate grade, ER+(100%), PR+(100%).      06/25/2016 Initial Diagnosis    Malignant neoplasm of upper-outer quadrant of left female breast ([place])     09/04/2016 Surgery    Left lumpectomy ([facility]): DCIS, low grade, 2.2 cm, margins negative      09/2016 -  Anti-estrogen oral therapy    Anastrozole daily       INTERVAL HISTORY:  Kristin Arroyo presents to the [facility] today for our initial meeting to review her survivorship care plan detailing her treatment course for breast cancer, as well as monitoring long-term side effects of that treatment, education regarding health maintenance, screening, and overall wellness and health promotion.     [name] is doing well.  She is taking her Anastrozole daily.  She is tolerating it well for the most part.  She does have hot flashes, but says she is managing them very well.  She is about to undergo evaluation of her fibroid tumors and ? Biopsy versus d and c.  She is otherwise well and is without questions or concerns.      REVIEW OF SYSTEMS:  Review of Systems  Constitutional: Negative for appetite change, chills, fatigue, fever and unexpected weight change.  HENT:   Negative for hearing loss and lump/mass.   Eyes: Negative for eye problems and icterus.  Respiratory: Negative for chest tightness, cough and shortness of breath.   Cardiovascular: Negative for chest pain, leg swelling and palpitations.  Gastrointestinal: Negative for abdominal distention, abdominal pain, constipation, diarrhea, nausea and vomiting.  Endocrine: Positive for hot flashes.  Skin: Negative for itching and rash.  Neurological: Negative for  dizziness, extremity weakness, headaches and numbness.  Hematological: Negative for adenopathy. Does not bruise/bleed easily.  Psychiatric/Behavioral: Negative for depression. The patient is not nervous/anxious.   Breast: Denies any new nodularity, masses, tenderness, nipple changes, or nipple discharge.      ONCOLOGY TREATMENT TEAM:  1. Surgeon:  Dr. Excell Seltzer at [facility] 2. Medical Oncologist: Dr. Jana Hakim      PAST MEDICAL/SURGICAL HISTORY:  Past Medical History:  Diagnosis Date  . Anxiety   . Breast cancer ([place])    left breast  . Depression   . Diabetes mellitus without complication (HCC)    diet controlled  . GERD (gastroesophageal reflux disease)   . Hypertension   . Respiratory disorder    occasional bronchitis  . Rheumatoid arthritis ([place])   . Sarcoid    Past Surgical History:  Procedure Laterality Date  . BREAST LUMPECTOMY WITH RADIOACTIVE SEED LOCALIZATION Left 09/04/2016   Procedure: BREAST LUMPECTOMY WITH RADIOACTIVE SEED LOCALIZATION;  Surgeon: Excell Seltzer, MD;  Location: [place];  Service: General;  Laterality: Left;  . CESAREAN SECTION    . LYMPH NODE BIOPSY     before 1976 by her neck to help diagnos sarcoidosis  . MOUTH SURGERY       ALLERGIES:  Allergies  Allergen Reactions  . Remicade [Infliximab] Swelling  . Zithromax [Azithromycin] Hives    Previously had issue but last dose did not have any issues     CURRENT MEDICATIONS:  Outpatient Encounter Prescriptions as of 01/18/2017  Medication Sig  . ADVAIR  DISKUS 500-50 MCG/DOSE AEPB Inhale 1 puff into the lungs 2 (two) times daily.   Marland Kitchen albuterol (PROVENTIL HFA;VENTOLIN HFA) 108 (90 Base) MCG/ACT inhaler Inhale 1-2 puffs into the lungs every 4 (four) hours as needed for wheezing or shortness of breath.  Marland Kitchen alendronate (FOSAMAX) 70 MG tablet Take 70 mg by mouth once a week. On Wednesdays  . anastrozole (ARIMIDEX) 1 MG tablet Take 1 tablet (1 mg total) by mouth  daily.  Marland Kitchen atorvastatin (LIPITOR) 40 MG tablet Take 40 mg by mouth every morning.   . Calcium Carb-Cholecalciferol (CALCIUM 600 + D PO) Take 1 tablet by mouth daily.  . cholecalciferol (VITAMIN D) 1000 units tablet Take 1,000 Units by mouth daily.  . cyclobenzaprine (FLEXERIL) 5 MG tablet Take 1 tablet (5 mg total) by mouth 3 (three) times daily as needed.  Marland Kitchen FIBER SELECT GUMMIES PO Take 1 each by mouth daily.  . irbesartan (AVAPRO) 300 MG tablet Take 300 mg by mouth every morning.   . methotrexate (RHEUMATREX) 2.5 MG tablet Take 20 mg by mouth once a week. Takes on Thursdays Caution:Chemotherapy. Protect from light.  . Omega-3 1000 MG CAPS Take 1 capsule by mouth daily.  . Turmeric 500 MG CAPS Take 1 capsule by mouth daily.   No facility-administered encounter medications on file as of 01/18/2017.      ONCOLOGIC FAMILY HISTORY:  Family History  Problem Relation Age of Onset  . Diabetes Mother   . Hypertension Mother   . Diabetes Father   . Hypertension Father      GENETIC COUNSELING/TESTING: Not indicated at this time  SOCIAL HISTORY:  Kristin Arroyo is single and lives alone in [place], [place].  She has one daughter who lives in [place], [place].  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  She denies any current or history of tobacco, alcohol, or illicit drug use.     PHYSICAL EXAMINATION:  Vital Signs:   Vitals:   01/18/17 1031  BP: (!) 161/71  Pulse: 63  Resp: 17  Temp: 98.3 F (36.8 C)   Filed Weights   01/18/17 1031  Weight: 204 lb 14.4 oz (92.9 kg)   General: Well-nourished, well-appearing female in no acute distress.  She is unaccompanied today.   HEENT: Head is normocephalic.  Pupils equal and reactive to light. Conjunctivae clear without exudate.  Sclerae anicteric. Oral mucosa is pink, moist.  Oropharynx is pink without lesions or erythema.  Lymph: No cervical, supraclavicular, or infraclavicular lymphadenopathy noted on palpation.   Cardiovascular: Regular rate and rhythm.Marland Kitchen Respiratory: Clear to auscultation bilaterally. Chest expansion symmetric; breathing non-labored.  GI: Abdomen soft and round; non-tender, non-distended. Bowel sounds normoactive.  GU: Deferred.  Neuro: No focal deficits. Steady gait.  Psych: Mood and affect normal and appropriate for situation.  Extremities: No edema. MSK: No focal spinal tenderness to palpation.  Full range of motion in bilateral upper extremities Skin: Warm and dry.  LABORATORY DATA:  None for this visit.  DIAGNOSTIC IMAGING:  None for this visit.      ASSESSMENT AND PLAN:  Ms.. Norwood is a pleasant 66 y.o. female with Stage 0 left breast DCIS, ER+/PR+, diagnosed in 06/2016, treated with lumpectomy, and anti-estrogen therapy with Anastrozole beginning in 09/2016.  She presents to the Survivorship Clinic for our initial meeting and routine follow-up post-completion of treatment for breast cancer.    1. Stage 0 left breast cancer:  Ms. Bartram is continuing to recover from definitive treatment for  breast cancer. She will follow-up with her medical oncologist, Dr. Jana Hakim in May, 2019 with history and physical exam per surveillance protocol.  Dr. Jana Hakim has ordered her mammogram for 08/2017. She will continue her anti-estrogen therapy with Anastrozole. Thus far, she is tolerating the Anastrozole well, with minimal side effects. She was instructed to make Dr. Jana Hakim or myself aware if she begins to experience any worsening side effects of the medication and I could see her back in clinic to help manage those side effects, as needed. Today, a comprehensive survivorship care plan and treatment summary was reviewed with the patient today detailing her breast cancer diagnosis, treatment course, potential late/long-term effects of treatment, appropriate follow-up care with recommendations for the future, and patient education resources.  A copy of this summary, along with a letter will be  sent to the patient's primary care provider via mail/fax/In Basket message after today's visit.    2. Bone health:  Given Ms. Amspacher's age/history of breast cancer and her current treatment regimen including anti-estrogen therapy with Anastrozole, she is at risk for bone demineralization.  Her last DEXA scan was about 3 years ago at her PCP office and she says those results were normal.  Dr. Jana Hakim has ordered for her to have a bone density test done in 08/2017.  She was given education on specific activities to promote bone health.  3. Cancer screening:  Due to Ms. Zavadil's history and her age, she should receive screening for skin cancers, colon cancer, and gynecologic cancers.  The information and recommendations are listed on the patient's comprehensive care plan/treatment summary and were reviewed in detail with the patient.    4. Health maintenance and wellness promotion: Ms. Strine was encouraged to consume 5-7 servings of fruits and vegetables per day. We reviewed the "Nutrition Rainbow" handout, as well as the handout "Take Control of Your Health and Reduce Your Cancer Risk" from the [facility].  She was also encouraged to engage in moderate to vigorous exercise for 30 minutes per day most days of the week. We discussed the [employer] fitness program, which is designed for cancer survivors to help them become more physically fit after cancer treatments.  She was instructed to limit her alcohol consumption and continue to abstain from tobacco use.     5. Support services/counseling: It is not uncommon for this period of the patient's cancer care trajectory to be one of many emotions and stressors.  We discussed an opportunity for her to participate in the next session of [facility] ("Finding Your New Normal") support group series designed for patients after they have completed treatment.   Ms. Losier was encouraged to take advantage of our many other support services programs, support  groups, and/or counseling in coping with her new life as a cancer survivor after completing anti-cancer treatment.  She was offered support today through active listening and expressive supportive counseling.  She was given information regarding our available services and encouraged to contact me with any questions or for help enrolling in any of our support group/programs.    Dispo:   -Return to cancer center in May, 2019 for follow up with Dr. Jana Hakim  -Mammogram due in 08/2017 per Dr. Jana Hakim order -Follow up with surgery in November or December with Dr. Excell Seltzer -She is welcome to return back to the Survivorship Clinic at any time; no additional follow-up needed at this time.  -Consider referral back to survivorship as a long-term survivor for continued surveillance  A total  of (30) minutes of face-to-face time was spent with this patient with greater than 50% of that time in counseling and care-coordination.   Gardenia Phlegm, NP Survivorship Program [facility] [phone]   Note: PRIMARY CARE PROVIDER [place], [place] [phone]

## 2017-01-21 NOTE — Anesthesia Preprocedure Evaluation (Signed)
Anesthesia Evaluation  Patient identified by MRN, date of birth, ID band Patient awake    Reviewed: Allergy & Precautions, NPO status , Patient's Chart, lab work & pertinent test results  Airway Mallampati: I  TM Distance: >3 FB Neck ROM: Full    Dental no notable dental hx.    Pulmonary former smoker,  Sarcoidosis, in remission   Pulmonary exam normal        Cardiovascular hypertension, Pt. on medications Normal cardiovascular exam  ECG: NSR, rate 63   Neuro/Psych PSYCHIATRIC DISORDERS Anxiety Depression negative neurological ROS     GI/Hepatic Neg liver ROS, GERD  Medicated and Controlled,  Endo/Other    Renal/GU negative Renal ROS  negative genitourinary   Musculoskeletal  (+) Arthritis , Rheumatoid disorders,    Abdominal (+) + obese,   Peds negative pediatric ROS (+)  Hematology negative hematology ROS (+)   Anesthesia Other Findings Hyperlipidemia  Obese  Breast CA  Reproductive/Obstetrics negative OB ROS                             Anesthesia Physical  Anesthesia Plan  ASA: III  Anesthesia Plan: General   Post-op Pain Management:    Induction: Intravenous  PONV Risk Score and Plan: 3 and Ondansetron, Dexamethasone, Propofol and Midazolam  Airway Management Planned: Oral ETT and LMA  Additional Equipment:   Intra-op Plan:   Post-operative Plan: Extubation in OR  Informed Consent: I have reviewed the patients History and Physical, chart, labs and discussed the procedure including the risks, benefits and alternatives for the proposed anesthesia with the patient or authorized representative who has indicated his/her understanding and acceptance.   Dental advisory given  Plan Discussed with: CRNA  Anesthesia Plan Comments:         Anesthesia Quick Evaluation

## 2017-01-22 ENCOUNTER — Encounter (HOSPITAL_COMMUNITY)
Admission: RE | Disposition: A | Discharge status: Home / Self Care | Payer: Self-pay | Source: Ambulatory Visit | Attending: Obstetrics & Gynecology

## 2017-01-22 ENCOUNTER — Encounter (HOSPITAL_COMMUNITY): Payer: Self-pay | Admitting: Anesthesiology

## 2017-01-22 ENCOUNTER — Ambulatory Visit (HOSPITAL_COMMUNITY): Payer: BC Managed Care – PPO | Admitting: Anesthesiology

## 2017-01-22 ENCOUNTER — Ambulatory Visit (HOSPITAL_COMMUNITY)
Admission: RE | Admit: 2017-01-22 | Discharge: 2017-01-22 | Disposition: A | Discharge status: Home / Self Care | Payer: BC Managed Care – PPO | Source: Ambulatory Visit | Attending: Obstetrics & Gynecology | Admitting: Obstetrics & Gynecology

## 2017-01-22 DIAGNOSIS — E669 Obesity, unspecified: Secondary | ICD-10-CM | POA: Diagnosis not present

## 2017-01-22 DIAGNOSIS — N840 Polyp of corpus uteri: Secondary | ICD-10-CM

## 2017-01-22 DIAGNOSIS — M069 Rheumatoid arthritis, unspecified: Secondary | ICD-10-CM | POA: Insufficient documentation

## 2017-01-22 DIAGNOSIS — D250 Submucous leiomyoma of uterus: Secondary | ICD-10-CM | POA: Diagnosis not present

## 2017-01-22 DIAGNOSIS — N950 Postmenopausal bleeding: Secondary | ICD-10-CM

## 2017-01-22 DIAGNOSIS — I10 Essential (primary) hypertension: Secondary | ICD-10-CM | POA: Diagnosis not present

## 2017-01-22 DIAGNOSIS — Z87891 Personal history of nicotine dependence: Secondary | ICD-10-CM | POA: Insufficient documentation

## 2017-01-22 DIAGNOSIS — C50912 Malignant neoplasm of unspecified site of left female breast: Secondary | ICD-10-CM | POA: Insufficient documentation

## 2017-01-22 DIAGNOSIS — N719 Inflammatory disease of uterus, unspecified: Secondary | ICD-10-CM | POA: Diagnosis not present

## 2017-01-22 DIAGNOSIS — D869 Sarcoidosis, unspecified: Secondary | ICD-10-CM | POA: Insufficient documentation

## 2017-01-22 DIAGNOSIS — E119 Type 2 diabetes mellitus without complications: Secondary | ICD-10-CM | POA: Diagnosis not present

## 2017-01-22 DIAGNOSIS — F329 Major depressive disorder, single episode, unspecified: Secondary | ICD-10-CM | POA: Diagnosis not present

## 2017-01-22 DIAGNOSIS — Z881 Allergy status to other antibiotic agents status: Secondary | ICD-10-CM | POA: Diagnosis not present

## 2017-01-22 DIAGNOSIS — D259 Leiomyoma of uterus, unspecified: Secondary | ICD-10-CM | POA: Diagnosis present

## 2017-01-22 DIAGNOSIS — Z888 Allergy status to other drugs, medicaments and biological substances status: Secondary | ICD-10-CM | POA: Insufficient documentation

## 2017-01-22 DIAGNOSIS — K219 Gastro-esophageal reflux disease without esophagitis: Secondary | ICD-10-CM | POA: Diagnosis not present

## 2017-01-22 DIAGNOSIS — M62838 Other muscle spasm: Secondary | ICD-10-CM | POA: Diagnosis not present

## 2017-01-22 DIAGNOSIS — E785 Hyperlipidemia, unspecified: Secondary | ICD-10-CM | POA: Diagnosis not present

## 2017-01-22 DIAGNOSIS — Z79899 Other long term (current) drug therapy: Secondary | ICD-10-CM | POA: Insufficient documentation

## 2017-01-22 DIAGNOSIS — F419 Anxiety disorder, unspecified: Secondary | ICD-10-CM | POA: Diagnosis not present

## 2017-01-22 HISTORY — DX: Postmenopausal bleeding: N95.0

## 2017-01-22 HISTORY — DX: Polyp of corpus uteri: N84.0

## 2017-01-22 HISTORY — DX: Leiomyoma of uterus, unspecified: D25.9

## 2017-01-22 HISTORY — PX: DILATATION & CURETTAGE/HYSTEROSCOPY WITH MYOSURE: SHX6511

## 2017-01-22 LAB — GLUCOSE, CAPILLARY
Glucose-Capillary: 112 mg/dL — ABNORMAL HIGH (ref 65–99)
Glucose-Capillary: 136 mg/dL — ABNORMAL HIGH (ref 65–99)

## 2017-01-22 SURGERY — DILATATION & CURETTAGE/HYSTEROSCOPY WITH MYOSURE
Anesthesia: General

## 2017-01-22 MED ORDER — ONDANSETRON HCL 4 MG/2ML IJ SOLN
INTRAMUSCULAR | Status: DC | PRN
Start: 2017-01-22 — End: 2017-01-22
  Administered 2017-01-22: 4 mg via INTRAVENOUS

## 2017-01-22 MED ORDER — FENTANYL CITRATE (PF) 100 MCG/2ML IJ SOLN
INTRAMUSCULAR | Status: AC
  Filled 2017-01-22: qty 2

## 2017-01-22 MED ORDER — LACTATED RINGERS IV SOLN
INTRAVENOUS | Status: DC
  Administered 2017-01-22: 1000 mL via INTRAVENOUS

## 2017-01-22 MED ORDER — FENTANYL CITRATE (PF) 100 MCG/2ML IJ SOLN
INTRAMUSCULAR | Status: AC
  Filled 2017-01-22: qty 4

## 2017-01-22 MED ORDER — SODIUM CHLORIDE 0.9 % IR SOLN
Status: DC | PRN
  Administered 2017-01-22: 3000 mL

## 2017-01-22 MED ORDER — MEPERIDINE HCL 25 MG/ML IJ SOLN: 6.2500 mg | INTRAMUSCULAR | Status: DC | PRN

## 2017-01-22 MED ORDER — SODIUM CHLORIDE 0.9 % IJ SOLN
INTRAMUSCULAR | Status: AC
  Filled 2017-01-22: qty 50

## 2017-01-22 MED ORDER — CEFAZOLIN SODIUM-DEXTROSE 2-4 GM/100ML-% IV SOLN
2.0000 g | INTRAVENOUS | Status: AC
  Administered 2017-01-22: 2 g via INTRAVENOUS

## 2017-01-22 MED ORDER — VASOPRESSIN 20 UNIT/ML IV SOLN
INTRAVENOUS | Status: DC | PRN
  Administered 2017-01-22: 20 mL via INTRAMUSCULAR

## 2017-01-22 MED ORDER — PROPOFOL 10 MG/ML IV BOLUS
INTRAVENOUS | Status: DC | PRN
  Administered 2017-01-22: 200 mg via INTRAVENOUS

## 2017-01-22 MED ORDER — IBUPROFEN 200 MG PO TABS: 600.0000 mg | ORAL_TABLET | Freq: Three times a day (TID) | ORAL | 0 refills | Status: AC | PRN

## 2017-01-22 MED ORDER — VASOPRESSIN 20 UNIT/ML IV SOLN
INTRAVENOUS | Status: AC
Start: 2017-01-22 — End: 2017-01-22
  Filled 2017-01-22: qty 1

## 2017-01-22 MED ORDER — LIDOCAINE HCL 1 % IJ SOLN
INTRAMUSCULAR | Status: AC
  Filled 2017-01-22: qty 20

## 2017-01-22 MED ORDER — ONDANSETRON HCL 4 MG/2ML IJ SOLN
INTRAMUSCULAR | Status: AC
  Filled 2017-01-22: qty 2

## 2017-01-22 MED ORDER — DEXAMETHASONE SODIUM PHOSPHATE 10 MG/ML IJ SOLN
INTRAMUSCULAR | Status: AC
  Filled 2017-01-22: qty 1

## 2017-01-22 MED ORDER — OXYCODONE HCL 5 MG/5ML PO SOLN: 5.0000 mg | Freq: Once | ORAL | Status: DC | PRN

## 2017-01-22 MED ORDER — KETOROLAC TROMETHAMINE 30 MG/ML IJ SOLN
INTRAMUSCULAR | Status: AC
  Filled 2017-01-22: qty 1

## 2017-01-22 MED ORDER — LIDOCAINE HCL (CARDIAC) 20 MG/ML IV SOLN
INTRAVENOUS | Status: AC
  Filled 2017-01-22: qty 5

## 2017-01-22 MED ORDER — DEXAMETHASONE SODIUM PHOSPHATE 4 MG/ML IJ SOLN
INTRAMUSCULAR | Status: DC | PRN
  Administered 2017-01-22: 10 mg via INTRAVENOUS

## 2017-01-22 MED ORDER — OXYCODONE HCL 5 MG PO TABS: 5.0000 mg | ORAL_TABLET | Freq: Once | ORAL | Status: DC | PRN

## 2017-01-22 MED ORDER — KETOROLAC TROMETHAMINE 30 MG/ML IJ SOLN
INTRAMUSCULAR | Status: DC | PRN
  Administered 2017-01-22: 30 mg via INTRAVENOUS

## 2017-01-22 MED ORDER — FENTANYL CITRATE (PF) 250 MCG/5ML IJ SOLN
INTRAMUSCULAR | Status: DC | PRN
Start: 2017-01-22 — End: 2017-01-22
  Administered 2017-01-22: 50 ug via INTRAVENOUS
  Administered 2017-01-22 (×2): 25 ug via INTRAVENOUS

## 2017-01-22 MED ORDER — HYDROMORPHONE HCL 1 MG/ML IJ SOLN: 0.2500 mg | INTRAMUSCULAR | Status: DC | PRN

## 2017-01-22 MED ORDER — GLYCOPYRROLATE 0.2 MG/ML IJ SOLN
INTRAMUSCULAR | Status: AC
  Filled 2017-01-22: qty 1

## 2017-01-22 MED ORDER — PROPOFOL 10 MG/ML IV BOLUS
INTRAVENOUS | Status: AC
  Filled 2017-01-22: qty 40

## 2017-01-22 MED ORDER — PROMETHAZINE HCL 25 MG/ML IJ SOLN: 6.2500 mg | INTRAMUSCULAR | Status: DC | PRN

## 2017-01-22 MED ORDER — LIDOCAINE HCL 1 % IJ SOLN
INTRAMUSCULAR | Status: DC | PRN
  Administered 2017-01-22: 20 mL

## 2017-01-22 MED ORDER — MIDAZOLAM HCL 2 MG/2ML IJ SOLN
INTRAMUSCULAR | Status: AC
  Filled 2017-01-22: qty 2

## 2017-01-22 MED ORDER — MIDAZOLAM HCL 2 MG/2ML IJ SOLN
INTRAMUSCULAR | Status: DC | PRN
  Administered 2017-01-22: 2 mg via INTRAVENOUS

## 2017-01-22 MED ORDER — LIDOCAINE HCL (CARDIAC) 20 MG/ML IV SOLN
INTRAVENOUS | Status: DC | PRN
  Administered 2017-01-22: 80 mg via INTRAVENOUS

## 2017-01-22 MED ORDER — GLYCOPYRROLATE 0.2 MG/ML IJ SOLN
INTRAMUSCULAR | Status: DC | PRN
  Administered 2017-01-22: .05 mg via INTRAVENOUS

## 2017-01-22 SURGICAL SUPPLY — 17 items
CANISTER SUCT 3000ML PPV (MISCELLANEOUS) ×2 IMPLANT
CATH ROBINSON RED A/P 16FR (CATHETERS) ×2 IMPLANT
CLOTH BEACON ORANGE TIMEOUT ST (SAFETY) ×2 IMPLANT
CONTAINER PREFILL 10% NBF 60ML (FORM) ×4 IMPLANT
DEVICE MYOSURE LITE (MISCELLANEOUS) IMPLANT
DEVICE MYOSURE REACH (MISCELLANEOUS) ×1 IMPLANT
FILTER ARTHROSCOPY CONVERTOR (FILTER) ×2 IMPLANT
GLOVE BIO SURGEON STRL SZ7 (GLOVE) ×2 IMPLANT
GLOVE BIOGEL PI IND STRL 7.0 (GLOVE) ×2 IMPLANT
GLOVE BIOGEL PI INDICATOR 7.0 (GLOVE) ×2
GOWN STRL REUS W/TWL LRG LVL3 (GOWN DISPOSABLE) ×4 IMPLANT
PACK VAGINAL MINOR WOMEN LF (CUSTOM PROCEDURE TRAY) ×2 IMPLANT
PAD OB MATERNITY 4.3X12.25 (PERSONAL CARE ITEMS) ×2 IMPLANT
SEAL ROD LENS SCOPE MYOSURE (ABLATOR) ×2 IMPLANT
TOWEL OR 17X24 6PK STRL BLUE (TOWEL DISPOSABLE) ×4 IMPLANT
TUBING AQUILEX INFLOW (TUBING) ×2 IMPLANT
TUBING AQUILEX OUTFLOW (TUBING) ×2 IMPLANT

## 2017-01-22 NOTE — Op Note (Signed)
Preoperative diagnosis:Postmenopausal bleeding, submucosal fibroid, endometrial polyps  Postop diagnosis: as above.  Procedure: Hysteroscopic myoma and polyps resection with Myosure device  Anesthesia General Surgeon: Azucena Fallen, MD  IV fluids 1 liter LR Estimated blood loss 5 cc Hysteroscopic saline deficit: 1020 cc  Urine output: straight catheter preop  50 cc Complications none  Condition stable  Disposition PACU  Specimen: Fragments of myoma and polyps   Procedure  Indication: Postmenopausal bleeding for years, enlarged fibroid uterus, thick endometrial stripe, endometrial biopsy normal, sonohystogram noted polyps and possibly large submucosal myoma.  After several counseling visits, patient agrees to proceed with surgery to stop bleeding.  Patient was counseled on risks/ complications including infection, bleeding, damage to internal organs, she understood and agrees, gave informed written consent.  Patient was brought to the operating room with IV running. Time out was carried out. She received preop 2 gm Ancef. She underwent general anesthesia without complications. She was given dorsolithotomy position. Parts were prepped and draped in standard fashion. Bladder was catheterized once.  Bimanual exam revealed uterus to be anteverted enlarged 16 weeks size. Speculum was placed and cervix was grasped with single-tooth tenaculum. Paracervical block given with 20 cc 1% plain Lidocaine and 20 cc of 20:50 Vasopressin/Saline mixture. Uterus was sounded to 14 cm. Cervical os was dilated to 21 [place] dilator. Myosure Hysteroscope was introduced in the uterine cavity under vision, using saline for irrigation.  Findings: a large 4 cm pedunculated submucosal myoma and 2 endometrial polyps noted, otherwise endometrial walls appeared normal without any other lesions.  Hysteroscopic myoma resection was performed in stepwise fashion followed by polyp resection using Myosure under vision. At the end of  the procedure a normal endometrial cavity and normal bilateral tubal ostii noted. Hemostasis was excellent.  Hysteroscope was removed. Specimen to pathology.  Saline fluid deficit 1020 cc.   All counts are correct x2. No complications. Patient brought to PACU extubated and stable.  Findings discussed with patient's daughter.   V.Nakiah Osgood, MD.

## 2017-01-22 NOTE — Transfer of Care (Signed)
Immediate Anesthesia Transfer of Care Note  Patient: Kristin Arroyo  Procedure(s) Performed: Procedure(s): DILATATION & CURETTAGE/HYSTEROSCOPY WITH MYOSURE (N/A)  Patient Location: PACU  Anesthesia Type:General  Level of Consciousness: awake, alert  and oriented  Airway & Oxygen Therapy: Patient Spontanous Breathing and Patient connected to nasal cannula oxygen  Post-op Assessment: Report given to RN and Post -op Vital signs reviewed and stable  Post vital signs: Reviewed and stable  Last Vitals:  Vitals:   01/22/17 0629  BP: (!) 154/81  Pulse: 71  Resp: 20  Temp: 36.7 C    Last Pain:  Vitals:   01/22/17 0629  TempSrc: Oral      Patients Stated Pain Goal: 3 (89/78/47 8412)  Complications: No apparent anesthesia complications

## 2017-01-22 NOTE — Anesthesia Procedure Notes (Signed)
Procedure Name: LMA Insertion Date/Time: 01/22/2017 7:53 AM Performed by: Flossie Dibble Pre-anesthesia Checklist: Patient identified, Emergency Drugs available, Suction available, Patient being monitored and Timeout performed Patient Re-evaluated:Patient Re-evaluated prior to induction Oxygen Delivery Method: Circle system utilized Preoxygenation: Pre-oxygenation with 100% oxygen Induction Type: IV induction Ventilation: Mask ventilation with difficulty LMA: LMA with gastric port inserted LMA Size: 4.0 Number of attempts: 2 (Straight LMA 4 attempted 1st but wouldn't seat ., Proseal 4 inserted easily on 1st attempt and patient ventilated easily. ) Placement Confirmation: positive ETCO2 and breath sounds checked- equal and bilateral Tube secured with: Tape Dental Injury: Teeth and Oropharynx as per pre-operative assessment

## 2017-01-22 NOTE — OR Nursing (Signed)
MD called at [facility]. OR staff ready for patient.

## 2017-01-22 NOTE — H&P (Signed)
Kristin Arroyo is an 66 y.o. female with fibroid uterus and postmenopausal bleeding with endometrial polyp, here for polypectomy.  Pt delayed surgery due to diagnosis of breast cancer and is s/p lumpectomy.  Normal Pap hx.  No LMP recorded. Patient is postmenopausal.    Past Medical History:  Diagnosis Date  . Anemia   . Anxiety   . Breast cancer ([place])    left breast  . Depression   . Diabetes mellitus without complication (HCC)    diet controlled  . GERD (gastroesophageal reflux disease)   . Hypertension   . Muscle spasm   . Respiratory disorder    occasional bronchitis, wheezing  . Rheumatoid arthritis ([place])   . Sarcoid     Past Surgical History:  Procedure Laterality Date  . BREAST LUMPECTOMY WITH RADIOACTIVE SEED LOCALIZATION Left 09/04/2016   Procedure: BREAST LUMPECTOMY WITH RADIOACTIVE SEED LOCALIZATION;  Surgeon: Excell Seltzer, MD;  Location: [place];  Service: General;  Laterality: Left;  . CESAREAN SECTION    . COLONOSCOPY    . FOOT SURGERY    . LYMPH NODE BIOPSY     before 1976 by her neck to help diagnos sarcoidosis  . MOUTH SURGERY      Family History  Problem Relation Age of Onset  . Diabetes Mother   . Hypertension Mother   . Diabetes Father   . Hypertension Father     Social History:  reports that she quit smoking about 42 years ago. She has never used smokeless tobacco. She reports that she drinks alcohol. She reports that she does not use drugs.  Allergies:  Allergies  Allergen Reactions  . Remicade [Infliximab] Swelling  . Zithromax [Azithromycin] Hives    Previously had issue but last dose did not have any issues    Prescriptions Prior to Admission  Medication Sig Dispense Refill Last Dose  . ADVAIR DISKUS 500-50 MCG/DOSE AEPB Inhale 1 puff into the lungs 2 (two) times daily.   0 01/22/2017 at 500  . alendronate (FOSAMAX) 70 MG tablet Take 70 mg by mouth once a week. On Wednesdays  1 Past Week at Unknown time  .  anastrozole (ARIMIDEX) 1 MG tablet Take 1 tablet (1 mg total) by mouth daily. 90 tablet 4 01/22/2017 at Unknown time  . atorvastatin (LIPITOR) 40 MG tablet Take 40 mg by mouth every morning.   1 01/22/2017 at Unknown time  . Calcium Carb-Cholecalciferol (CALCIUM 600 + D PO) Take 1 tablet by mouth daily.   Past Week at Unknown time  . cholecalciferol (VITAMIN D) 1000 units tablet Take 1,000 Units by mouth daily.   Past Week at Unknown time  . cyclobenzaprine (FLEXERIL) 5 MG tablet Take 1 tablet (5 mg total) by mouth 3 (three) times daily as needed. 20 tablet 0 Past Week at Unknown time  . FIBER SELECT GUMMIES PO Take 1 each by mouth daily.   01/21/2017 at Unknown time  . folic acid (FOLVITE) 1 MG tablet Take 1 mg by mouth daily.   Past Week at Unknown time  . irbesartan (AVAPRO) 300 MG tablet Take 300 mg by mouth every morning.   1 01/22/2017 at Unknown time  . methotrexate (RHEUMATREX) 2.5 MG tablet Take 20 mg by mouth once a week. Takes on Thursdays Caution:Chemotherapy. Protect from light.   Past Week at Unknown time  . Omega-3 1000 MG CAPS Take 1 capsule by mouth daily.   01/21/2017 at Unknown time  . Turmeric 500 MG CAPS Take 1  capsule by mouth daily.   Past Week at Unknown time  . albuterol (PROVENTIL HFA;VENTOLIN HFA) 108 (90 Base) MCG/ACT inhaler Inhale 1-2 puffs into the lungs every 4 (four) hours as needed for wheezing or shortness of breath.   More than a month at Unknown time    ROS neg  Blood pressure (!) 154/81, pulse 71, temperature 98.1 F (36.7 C), temperature source Oral, resp. rate 20. Physical Exam Physical exam:  A&O x 3, no acute distress. Pleasant HEENT neg, no thyromegaly Lungs CTA bilat CV RRR, S1S2 normal Abdo soft, non tender, non acute Extr no edema/ tenderness Pelvic  Enlarged uterus with fibroids. Thick endometrial stripe with large polyp   Assessment/Plan: 66 yo menopausal female with uterine bleeding, endometrial polyp. Plan hysteroscopic polypectomy with  myosure and endometrial curettage.  Risks/complications of surgery reviewed incl infection, bleeding, damage to internal organs including bladder, bowels, ureters, blood vessels, other risks from anesthesia, VTE and delayed complications of any surgery, complications in future surgery reviewed.  [name] R 01/22/2017, 6:54 AM

## 2017-01-22 NOTE — Discharge Instructions (Addendum)
DISCHARGE INSTRUCTIONS: D&C / D&E °The following instructions have been prepared to help you care for yourself upon your return home. °  °Personal hygiene: °• Use sanitary pads for vaginal drainage, not tampons. °• Shower the day after your procedure. °• NO tub baths, pools or Jacuzzis for 2-3 weeks. °• Wipe front to back after using the bathroom. ° °Activity and limitations: °• Do NOT drive or operate any equipment for 24 hours. The effects of anesthesia are still present and drowsiness may result. °• Do NOT rest in bed all day. °• Walking is encouraged. °• Walk up and down stairs slowly. °• You may resume your normal activity in one to two days or as indicated by your physician. ° °Sexual activity: NO intercourse for at least 2 weeks after the procedure, or as indicated by your physician. ° °Diet: Eat a light meal as desired this evening. You may resume your usual diet tomorrow. ° °Return to work: You may resume your work activities in one to two days or as indicated by your doctor. ° °What to expect after your surgery: Expect to have vaginal bleeding/discharge for 2-3 days and spotting for up to 10 days. It is not unusual to have soreness for up to 1-2 weeks. You may have a slight burning sensation when you urinate for the first day. Mild cramps may continue for a couple of days. You may have a regular period in 2-6 weeks. ° °Call your doctor for any of the following: °• Excessive vaginal bleeding, saturating and changing one pad every hour. °• Inability to urinate 6 hours after discharge from hospital. °• Pain not relieved by pain medication. °• Fever of 100.4° F or greater. °• Unusual vaginal discharge or odor. ° ° Call for an appointment:  ° ° °Patient’s signature: ______________________ ° °Nurse’s signature ________________________ ° °Support person's signature_______________________ ° ° °Hysteroscopy, Care After °Refer to this sheet in the next few weeks. These instructions provide you with information on  caring for yourself after your procedure. Your health care provider may also give you more specific instructions. Your treatment has been planned according to current medical practices, but problems sometimes occur. Call your health care provider if you have any problems or questions after your procedure. °What can I expect after the procedure? °After your procedure, it is typical to have the following: °· You may have some cramping. This normally lasts for a couple days. °· You may have bleeding. This can vary from light spotting for a few days to menstrual-like bleeding for 3-7 days. ° °Follow these instructions at home: °· Rest for the first 1-2 days after the procedure. °· Only take over-the-counter or prescription medicines as directed by your health care provider. Do not take aspirin. It can increase the chances of bleeding. °· Take showers instead of baths for 2 weeks or as directed by your health care provider. °· Do not drive for 24 hours or as directed. °· Do not drink alcohol while taking pain medicine. °· Do not use tampons, douche, or have sexual intercourse for 2 weeks or until your health care provider says it is okay. °· Take your temperature twice a day for 4-5 days. Write it down each time. °· Follow your health care provider's advice about diet, exercise, and lifting. °· If you develop constipation, you may: °? Take a mild laxative if your health care provider approves. °? Add bran foods to your diet. °? Drink enough fluids to keep your urine clear or pale yellow. °·   Try to have someone with you or available to you for the first 24-48 hours, especially if you were given a general anesthetic. °· Follow up with your health care provider as directed. °Contact a health care provider if: °· You feel dizzy or lightheaded. °· You feel sick to your stomach (nauseous). °· You have abnormal vaginal discharge. °· You have a rash. °· You have pain that is not controlled with medicine. °Get help right away  if: °· You have bleeding that is heavier than a normal menstrual period. °· You have a fever. °· You have increasing cramps or pain, not controlled with medicine. °· You have new belly (abdominal) pain. °· You pass out. °· You have pain in the tops of your shoulders (shoulder strap areas). °· You have shortness of breath. °This information is not intended to replace advice given to you by your health care provider. Make sure you discuss any questions you have with your health care provider. °Document Released: 04/19/2013 Document Revised: 12/05/2015 Document Reviewed: 01/26/2013 °Elsevier Interactive Patient Education © 2017 Elsevier Inc. ° °

## 2017-01-25 NOTE — Anesthesia Postprocedure Evaluation (Signed)
Anesthesia Post Note  Patient: Winona Sison  Procedure(s) Performed: Procedure(s) (LRB): DILATATION & CURETTAGE/HYSTEROSCOPY WITH MYOSURE (N/A)     Patient location during evaluation: PACU Anesthesia Type: General Level of consciousness: awake and alert Pain management: pain level controlled Vital Signs Assessment: post-procedure vital signs reviewed and stable Respiratory status: spontaneous breathing, nonlabored ventilation and respiratory function stable Cardiovascular status: blood pressure returned to baseline and stable Postop Assessment: no signs of nausea or vomiting Anesthetic complications: no    Last Vitals:  Vitals:   01/22/17 1036 01/22/17 1100  BP:  138/86  Pulse: 65 74  Resp: 17 20  Temp:      Last Pain:  Vitals:   01/22/17 0629  TempSrc: Oral                 Lynda Rainwater

## 2017-01-26 ENCOUNTER — Encounter (HOSPITAL_COMMUNITY): Payer: Self-pay | Admitting: Obstetrics & Gynecology

## 2017-07-15 ENCOUNTER — Ambulatory Visit: Payer: BC Managed Care – PPO | Admitting: Podiatry

## 2017-07-15 ENCOUNTER — Ambulatory Visit (INDEPENDENT_AMBULATORY_CARE_PROVIDER_SITE_OTHER): Payer: BC Managed Care – PPO

## 2017-07-15 ENCOUNTER — Encounter: Payer: Self-pay | Admitting: Podiatry

## 2017-07-15 DIAGNOSIS — M21619 Bunion of unspecified foot: Secondary | ICD-10-CM | POA: Diagnosis not present

## 2017-07-15 DIAGNOSIS — M659 Synovitis and tenosynovitis, unspecified: Secondary | ICD-10-CM | POA: Diagnosis not present

## 2017-07-15 DIAGNOSIS — M2041 Other hammer toe(s) (acquired), right foot: Secondary | ICD-10-CM | POA: Diagnosis not present

## 2017-07-15 DIAGNOSIS — L84 Corns and callosities: Secondary | ICD-10-CM

## 2017-07-15 DIAGNOSIS — M2042 Other hammer toe(s) (acquired), left foot: Secondary | ICD-10-CM

## 2017-07-15 DIAGNOSIS — E119 Type 2 diabetes mellitus without complications: Secondary | ICD-10-CM

## 2017-07-15 NOTE — Progress Notes (Signed)
Subjective:   Patient ID: Kristin Arroyo, female   DOB: 67 y.o.   MRN: [id number]   HPI 67 year old female presents the office today for concerns of a painful corn in between her right big toe and second toe which is been ongoing for several months.  She also gets a corn to several of her toes.  She states the areas are painful with pressure in shoes.  She does have a history of bunion surgery several years ago over 20 years.  She states that the area between her toes started a couple months ago but denies any recent injury or trauma.  She denies any swelling or redness.  It is worse with closed in shoes.  No treatment.  No other concerns.  She was referred by Norberta Keens    Review of Systems  All other systems reviewed and are negative.       Objective:  Physical Exam  General: AAO x3, NAD  Dermatological: Multiple hyperkeratotic lesions are present.  Most notably along the right medial second digit and lateral aspect of the hallux of the corresponding hyperkeratotic lesions.  Also small hyperkeratotic lesions to the dorsal aspect the right fourth, left third, fourth, fifth digits.  Also hyperkeratotic lesions present bilateral submetatarsal 5.  Upon debridement there is no underlying ulceration, drainage or any clinical signs of infection are noted.  There is no other open lesions or pre-ulcerative lesion identified.  Nails appear to be hypertrophic, dystrophic, discolored with yellow-brown discoloration but there is no tenderness of the nails.  Vascular: Dorsalis Pedis artery and Posterior Tibial artery pedal pulses are 2/4 bilateral with immedate capillary fill time. Pedal hair growth present. No varicosities and no lower extremity edema present bilateral. There is no pain with calf compression, swelling, warmth, erythema.   Neruologic: Grossly intact via light touch bilateral. Protective threshold with Semmes Wienstein monofilament intact to all pedal sites bilateral.   Musculoskeletal:  HAV is present as well as hammertoe contractures which are semi-reducible.  There is no area pinpoint tenderness.  No overlying edema, erythema, increase in warmth.  Muscular strength 5/5 in all groups tested bilateral.  Gait: Unassisted, Nonantalgic.       Assessment:   Multiple hyperkeratotic lesions as a result of digital deformity; onychodystrophy ; Rheumatoid Arthritis      Plan:  -Treatment options discussed including all alternatives, risks, and complications -Etiology of symptoms were discussed -We discussed conservative as well as surgical treatment options.  This time she had no conservative treatments were start with this.  Sharply debrided hyperkeratotic lesions without any complications or bleeding after verbal consent was obtained and she requested the service today.  All lesions were sharply debrided today x8.  Discussed offloading pads were dispensed toe separator's as well.  We discussed the change in shoes as well.  Possibly consider inserts. -Nails are somewhat thickened we discussed treatment options for this and she is concerned with more natural treatment. -She agrees this plan has no further questions or concerns today.  Trula Slade DPM

## 2017-10-29 ENCOUNTER — Telehealth: Payer: Self-pay | Admitting: Oncology

## 2017-10-29 NOTE — Telephone Encounter (Signed)
Called pt re appts being moved due to [facility] - spoke with pt re appts

## 2017-11-07 ENCOUNTER — Other Ambulatory Visit: Payer: Self-pay | Admitting: Oncology

## 2017-11-18 ENCOUNTER — Ambulatory Visit: Payer: BC Managed Care – PPO | Admitting: Oncology

## 2017-12-02 ENCOUNTER — Telehealth: Payer: Self-pay

## 2017-12-02 NOTE — Telephone Encounter (Signed)
Verified  appointment. Per 5/23 phone que

## 2017-12-02 NOTE — Progress Notes (Signed)
[name]  [phone] [phone]     ID: Kristin Arroyo DOB: 05/25/1951  MR#: [id number]  WHQ#:[id number]  Patient Care Team: Prince Solian, MD as PCP - General (Internal Medicine) Excell Seltzer, MD as Consulting Physician (General Surgery) Nataliyah Packham, Virgie Dad, MD as Consulting Physician (Oncology) Gery Pray, MD as Consulting Physician (Radiation Oncology) Azucena Fallen, MD as Consulting Physician (Obstetrics and Gynecology) Valinda Party, MD (Rheumatology) Brion Aliment, RN as Registered Nurse Causey, Charlestine Massed, NP as Nurse Practitioner (Hematology and Oncology) OTHER MD:  CHIEF COMPLAINT: Ductal carcinoma in situ  CURRENT TREATMENT: Anastrozole  BREAST CANCER HISTORY: From the original intake note:  The patient had routine screening mammography November 2017 showing an area of microcalcifications in the left breast. She was referred for diagnostic mammography at the [facility] 06/17/2016 and this showed the breast density to be category B. In the upper-outer quadrant of the left breast there was an area of heterogeneous microcalcifications measuring 0.4 cm.  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  Her subsequent history is as detailed below.  INTERVAL HISTORY: Kristin Arroyo returns today for a follow-up and treatment of her ductal carcinoma IN SITU.  Interval history is generally unremarkable.  She continues to work at a [employer] and enjoys it but is beginning to think of possibly retiring late this year or early next year.  The patient continues on anastrozole, with good tolerance.  She has rare hot flashes.  She does have vaginal dryness problems which was present before she started the anastrozole.  It is not  appreciably worse at present   REVIEW OF SYSTEMS Inita is not exercising regularly.  She denies unusual headaches visual changes nausea vomiting cough phlegm production pleurisy shortness of breath or any change in bowel or bladder habits.  A detailed review of systems today was otherwise stable  PAST MEDICAL HISTORY: Past Medical History:  Diagnosis Date  . Anemia   . Anxiety   . Breast cancer ([facility])    left breast  . Depression   . Diabetes mellitus without complication (HCC)    diet controlled  . Endometrial polyp 01/22/2017  . Fibroid uterus 01/22/2017  . GERD (gastroesophageal reflux disease)   . Hypertension   . Muscle spasm   . Postmenopause bleeding 01/22/2017  . Respiratory disorder    occasional bronchitis, wheezing  . Rheumatoid arthritis ([place])   . Sarcoid     PAST SURGICAL HISTORY: Past Surgical History:  Procedure Laterality Date  . BREAST LUMPECTOMY WITH RADIOACTIVE SEED LOCALIZATION Left 09/04/2016   Procedure: BREAST LUMPECTOMY WITH RADIOACTIVE SEED LOCALIZATION;  Surgeon: Excell Seltzer, MD;  Location: [facility];  Service: General;  Laterality: Left;  . CESAREAN SECTION    . COLONOSCOPY    . DILATATION & CURETTAGE/HYSTEROSCOPY WITH MYOSURE N/A 01/22/2017   Procedure: DILATATION & CURETTAGE/HYSTEROSCOPY WITH MYOSURE;  Surgeon: Azucena Fallen, MD;  Location: [facility];  Service: Gynecology;  Laterality: N/A;  . FOOT SURGERY    . LYMPH NODE BIOPSY     before 1976 by her neck to help diagnos sarcoidosis  . MOUTH SURGERY      FAMILY HISTORY Family History  Problem Relation Age of Onset  . Diabetes Mother   . Hypertension Mother   . Diabetes Father   . Hypertension Father   Patient's  father died in his 6s from heart disease. The patient's mother experienced sudden death at age 53. The patient had 2 brothers, 1 sister. There is a family history of melanoma.  GYNECOLOGIC HISTORY:  No LMP recorded. Patient is postmenopausal. Menarche age 19,  first live birth age 45. The patient is GX P1. She went through menopause in her early 31s. She did not take hormone replacement. She used oral contraceptives for some time in the remote past without complications.  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 At home it's just her "and Jesus", with no pets. Her daughter [name] lives in [place] him a and works in [employer] setting. The patient has no grandchildren. She attends a [employer]ic[employer], medical church.     ADVANCED DIRECTIVES: Not in place   HEALTH MAINTENANCE: Social History   Tobacco Use  . Smoking status: Former Smoker    Last attempt to quit: 07/01/1974    Years since quitting: 43.4  . Smokeless tobacco: Never Used  Substance Use Topics  . Alcohol use: Yes    Comment: occ  . Drug use: No     Colonoscopy:2016/[facility]  PAP: 2017  Bone density: 05/14/2009 at [facility], T score 0.4 (normal)   Allergies  Allergen Reactions  . Remicade [Infliximab] Swelling  . Zithromax [Azithromycin] Hives    Previously had issue but last dose did not have any issues    Current Outpatient Medications  Medication Sig Dispense Refill  . ADVAIR DISKUS 500-50 MCG/DOSE AEPB Inhale 1 puff into the lungs 2 (two) times daily.   0  . albuterol (PROVENTIL HFA;VENTOLIN HFA) 108 (90 Base) MCG/ACT inhaler Inhale 1-2 puffs into the lungs every 4 (four) hours as needed for wheezing or shortness of breath.    Marland Kitchen alendronate (FOSAMAX) 70 MG tablet Take 70 mg by mouth once a week. On Wednesdays  1  . anastrozole (ARIMIDEX) 1 MG tablet TAKE 1 TABLET BY MOUTH EVERY DAY 90 tablet 0  . atorvastatin (LIPITOR) 40 MG tablet Take 40 mg by mouth every morning.   1  . Calcium Carb-Cholecalciferol (CALCIUM 600 + D PO) Take 1 tablet by mouth daily.    . cholecalciferol (VITAMIN D) 1000 units tablet Take 1,000 Units by mouth daily.    . cyclobenzaprine (FLEXERIL) 5 MG tablet Take 1  tablet (5 mg total) by mouth 3 (three) times daily as needed. 20 tablet 0  . FIBER SELECT GUMMIES PO Take 1 each by mouth daily.    . folic acid (FOLVITE) 1 MG tablet Take 1 mg by mouth daily.    Marland Kitchen ibuprofen (MOTRIN IB) 200 MG tablet Take 3 tablets (600 mg total) by mouth every 8 (eight) hours as needed. 30 tablet 0  . irbesartan (AVAPRO) 300 MG tablet Take 300 mg by mouth every morning.   1  . methotrexate (RHEUMATREX) 2.5 MG tablet Take 20 mg by mouth once a week. Takes on Thursdays Caution:Chemotherapy. Protect from light.    . Omega-3 1000 MG CAPS Take 1 capsule by mouth daily.    . Turmeric 500 MG CAPS Take 1 capsule by mouth daily.     No current facility-administered medications for this visit.     OBJECTIVE: Middle-aged African-American woman who appears stated age 45:   12/03/17 1500  BP: (!) 144/58  Pulse: 66  Resp: 18  Temp: 98.4 F (36.9 C)  SpO2: 100%     Body mass index is 36.89  kg/m.    ECOG FS:1 - Symptomatic but completely ambulatory  Sclerae unicteric, EOMs intact Oropharynx clear and moist No cervical or supraclavicular adenopathy Lungs no rales or rhonchi Heart regular rate and rhythm Abd soft, nontender, positive bowel sounds MSK no focal spinal tenderness, no upper extremity lymphedema Neuro: nonfocal, well oriented, appropriate affect Breasts: The right breast is benign.  The left breast has undergone lumpectomy.  There is no evidence of local recurrence.  Both axillae are benign.  LAB RESULTS:  CMP     Component Value Date/Time   NA 139 01/18/2017 1525   NA 143 07/01/2016 0847   K 4.1 01/18/2017 1525   K 3.7 07/01/2016 0847   CL 105 01/18/2017 1525   CO2 27 01/18/2017 1525   CO2 29 07/01/2016 0847   GLUCOSE 128 (H) 01/18/2017 1525   GLUCOSE 124 07/01/2016 0847   BUN 14 01/18/2017 1525   BUN 7.5 07/01/2016 0847   CREATININE 0.90 01/18/2017 1525   CREATININE 0.7 07/01/2016 0847   CALCIUM 9.7 01/18/2017 1525   CALCIUM 9.1 07/01/2016 0847     PROT 7.1 07/01/2016 0847   ALBUMIN 3.4 (L) 07/01/2016 0847   AST 17 07/01/2016 0847   ALT 17 07/01/2016 0847   ALKPHOS 77 07/01/2016 0847   BILITOT 0.54 07/01/2016 0847   GFRNONAA >60 01/18/2017 1525   GFRAA >60 01/18/2017 1525    INo results found for: SPEP, UPEP  Lab Results  Component Value Date   WBC 5.4 01/18/2017   NEUTROABS 2.5 07/01/2016   HGB 12.8 01/18/2017   HCT 40.8 01/18/2017   MCV 78.0 01/18/2017   PLT 196 01/18/2017      Chemistry      Component Value Date/Time   NA 139 01/18/2017 1525   NA 143 07/01/2016 0847   K 4.1 01/18/2017 1525   K 3.7 07/01/2016 0847   CL 105 01/18/2017 1525   CO2 27 01/18/2017 1525   CO2 29 07/01/2016 0847   BUN 14 01/18/2017 1525   BUN 7.5 07/01/2016 0847   CREATININE 0.90 01/18/2017 1525   CREATININE 0.7 07/01/2016 0847      Component Value Date/Time   CALCIUM 9.7 01/18/2017 1525   CALCIUM 9.1 07/01/2016 0847   ALKPHOS 77 07/01/2016 0847   AST 17 07/01/2016 0847   ALT 17 07/01/2016 0847   BILITOT 0.54 07/01/2016 0847       No results found for: LABCA2  No components found for: LABCA125  No results for input(s): INR in the last 168 hours.  Urinalysis No results found for: COLORURINE, APPEARANCEUR, LABSPEC, PHURINE, GLUCOSEU, HGBUR, BILIRUBINUR, KETONESUR, PROTEINUR, UROBILINOGEN, NITRITE, LEUKOCYTESUR   STUDIES: She is behind on mammography and bone density data  ELIGIBLE FOR AVAILABLE RESEARCH PROTOCOL:  Decided against COMET  trial  ASSESSMENT: 67 y.o. [place] woman status post left breast upper outer quadrant biopsy 06/17/2016 for ductal carcinoma in situ, grade 2, estrogen and progesterone receptor positive  (1)  status post left lumpectomy 09/04/2016 for low-grade ductal carcinoma in situ, with negative margins.   (2) started anastrozole 08/05/2016, discontinued after 3 days with unusual symptoms, which immediately resolved   (3) adjuvant radiation felt to only marginally improve her risk of local  recurrence assuming she took anti-estrogens, with no effect on survival: patient opted against it  (4) resumed anastrozole March 2018  PLAN: Elisabeth is now a little over a year out from definitive surgery for her breast cancer with no evidence of disease recurrence.  This is favorable.  She is tolerating anastrozole well and the plan will be to continue that a total of 5 years.  She is on alendronate, which should help, but she tells me she has not had a bone density in some time.  She is overdue for mammography.  We can get both done at the breast center in June of this year and I have entered those orders.  She does decide to retire I have suggested she consider volunteering here.  I think she would be terrific  She will see me again next year after mammography next year.  She knows to call for any other issues that may develop before the next visit. Haziel Molner, Virgie Dad, MD  12/03/17 3:31 PM Medical Oncology and Hematology [facility] [address] [place], [place] [address] Tel. [phone]    Fax. [phone]  This document serves as a record of services personally performed by Sullivan Lone, MD. It was created on his behalf by Margit Banda, a trained medical scribe. The creation of this record is based on the scribe's personal observations and the provider's statements to them.   I have reviewed the above documentation for accuracy and completeness, and I agree with the above.

## 2017-12-03 ENCOUNTER — Inpatient Hospital Stay: Payer: BC Managed Care – PPO | Attending: Oncology | Admitting: Oncology

## 2017-12-03 ENCOUNTER — Telehealth: Payer: Self-pay | Admitting: Oncology

## 2017-12-03 VITALS — BP 144/58 | HR 66 | Temp 98.4°F | Resp 18 | Ht 64.0 in | Wt 214.9 lb

## 2017-12-03 DIAGNOSIS — I10 Essential (primary) hypertension: Secondary | ICD-10-CM | POA: Diagnosis not present

## 2017-12-03 DIAGNOSIS — K219 Gastro-esophageal reflux disease without esophagitis: Secondary | ICD-10-CM | POA: Diagnosis not present

## 2017-12-03 DIAGNOSIS — C50412 Malignant neoplasm of upper-outer quadrant of left female breast: Secondary | ICD-10-CM

## 2017-12-03 DIAGNOSIS — D0512 Intraductal carcinoma in situ of left breast: Secondary | ICD-10-CM | POA: Diagnosis not present

## 2017-12-03 DIAGNOSIS — Z79899 Other long term (current) drug therapy: Secondary | ICD-10-CM | POA: Diagnosis not present

## 2017-12-03 DIAGNOSIS — Z79811 Long term (current) use of aromatase inhibitors: Secondary | ICD-10-CM | POA: Diagnosis not present

## 2017-12-03 DIAGNOSIS — F419 Anxiety disorder, unspecified: Secondary | ICD-10-CM | POA: Insufficient documentation

## 2017-12-03 DIAGNOSIS — F329 Major depressive disorder, single episode, unspecified: Secondary | ICD-10-CM | POA: Diagnosis not present

## 2017-12-03 DIAGNOSIS — Z170 Estrogen receptor positive status [ER+]: Secondary | ICD-10-CM | POA: Insufficient documentation

## 2017-12-03 DIAGNOSIS — E119 Type 2 diabetes mellitus without complications: Secondary | ICD-10-CM | POA: Insufficient documentation

## 2017-12-03 DIAGNOSIS — M069 Rheumatoid arthritis, unspecified: Secondary | ICD-10-CM | POA: Diagnosis not present

## 2017-12-03 DIAGNOSIS — M8580 Other specified disorders of bone density and structure, unspecified site: Secondary | ICD-10-CM

## 2017-12-03 NOTE — Telephone Encounter (Signed)
Gave avs and calendar ° °

## 2018-01-10 HISTORY — PX: BREAST BIOPSY: SHX20

## 2018-01-17 ENCOUNTER — Ambulatory Visit
Admission: RE | Admit: 2018-01-17 | Discharge: 2018-01-17 | Disposition: A | Discharge status: Home / Self Care | Payer: BC Managed Care – PPO | Source: Ambulatory Visit | Attending: Oncology | Admitting: Oncology

## 2018-01-17 ENCOUNTER — Other Ambulatory Visit: Payer: Self-pay | Admitting: Oncology

## 2018-01-17 DIAGNOSIS — C50412 Malignant neoplasm of upper-outer quadrant of left female breast: Secondary | ICD-10-CM

## 2018-01-17 DIAGNOSIS — M069 Rheumatoid arthritis, unspecified: Secondary | ICD-10-CM

## 2018-01-17 DIAGNOSIS — M8580 Other specified disorders of bone density and structure, unspecified site: Secondary | ICD-10-CM

## 2018-01-17 DIAGNOSIS — Z170 Estrogen receptor positive status [ER+]: Principal | ICD-10-CM

## 2018-01-17 DIAGNOSIS — R921 Mammographic calcification found on diagnostic imaging of breast: Secondary | ICD-10-CM

## 2018-01-18 ENCOUNTER — Other Ambulatory Visit: Payer: Self-pay | Admitting: Adult Health

## 2018-01-18 DIAGNOSIS — R921 Mammographic calcification found on diagnostic imaging of breast: Secondary | ICD-10-CM

## 2018-01-18 DIAGNOSIS — M8580 Other specified disorders of bone density and structure, unspecified site: Secondary | ICD-10-CM

## 2018-01-18 DIAGNOSIS — C50412 Malignant neoplasm of upper-outer quadrant of left female breast: Secondary | ICD-10-CM

## 2018-01-18 DIAGNOSIS — M069 Rheumatoid arthritis, unspecified: Secondary | ICD-10-CM

## 2018-01-19 ENCOUNTER — Other Ambulatory Visit: Payer: Self-pay | Admitting: Adult Health

## 2018-01-19 ENCOUNTER — Ambulatory Visit
Admission: RE | Admit: 2018-01-19 | Discharge: 2018-01-19 | Disposition: A | Discharge status: Home / Self Care | Payer: BC Managed Care – PPO | Source: Ambulatory Visit | Attending: Adult Health | Admitting: Adult Health

## 2018-01-19 ENCOUNTER — Ambulatory Visit
Admission: RE | Admit: 2018-01-19 | Discharge: 2018-01-19 | Disposition: A | Discharge status: Home / Self Care | Payer: BC Managed Care – PPO | Source: Ambulatory Visit | Attending: Oncology | Admitting: Oncology

## 2018-01-19 DIAGNOSIS — M069 Rheumatoid arthritis, unspecified: Secondary | ICD-10-CM

## 2018-01-19 DIAGNOSIS — R921 Mammographic calcification found on diagnostic imaging of breast: Secondary | ICD-10-CM

## 2018-01-19 DIAGNOSIS — C50412 Malignant neoplasm of upper-outer quadrant of left female breast: Secondary | ICD-10-CM

## 2018-01-19 DIAGNOSIS — M8580 Other specified disorders of bone density and structure, unspecified site: Secondary | ICD-10-CM

## 2018-02-05 ENCOUNTER — Other Ambulatory Visit: Payer: Self-pay | Admitting: Oncology

## 2018-02-08 ENCOUNTER — Telehealth: Payer: Self-pay | Admitting: Oncology

## 2018-02-08 NOTE — Telephone Encounter (Signed)
Tried to reach regarding 12/07/18 per 7/30 sch msg. I did mail a letter

## 2018-04-20 ENCOUNTER — Telehealth: Payer: Self-pay | Admitting: Podiatry

## 2018-04-20 NOTE — Telephone Encounter (Signed)
I would like my x-rays sent to my rheumatoid doctor at [facility]. My home number is [phone] and my office number is [phone].

## 2018-04-20 NOTE — Telephone Encounter (Signed)
Called pt to let her know she needs to fill out and sign a medical records release form. Pt requested the form be emailed to her at [email or web address].

## 2018-04-25 ENCOUNTER — Encounter: Payer: Self-pay | Admitting: Podiatry

## 2018-04-25 NOTE — Progress Notes (Signed)
Pt's disc of x-rays was placed up front to be mailed out today to [facility] per pt's request.

## 2018-05-08 ENCOUNTER — Other Ambulatory Visit: Payer: Self-pay | Admitting: Oncology

## 2018-07-04 ENCOUNTER — Ambulatory Visit
Admission: RE | Admit: 2018-07-04 | Discharge: 2018-07-04 | Disposition: A | Discharge status: Home / Self Care | Payer: BC Managed Care – PPO | Source: Ambulatory Visit | Attending: Oncology | Admitting: Oncology

## 2018-08-08 ENCOUNTER — Other Ambulatory Visit: Payer: Self-pay | Admitting: Oncology

## 2018-11-30 ENCOUNTER — Telehealth: Payer: Self-pay | Admitting: Oncology

## 2018-11-30 NOTE — Telephone Encounter (Signed)
Per 5/21 schedule message cancel 5/27 lab/fu patient to keep July appointments. Confirmed with patient.

## 2018-12-07 ENCOUNTER — Ambulatory Visit: Payer: BC Managed Care – PPO | Admitting: Oncology

## 2018-12-07 ENCOUNTER — Other Ambulatory Visit: Payer: BC Managed Care – PPO

## 2019-01-15 ENCOUNTER — Other Ambulatory Visit: Payer: Self-pay | Admitting: Oncology

## 2019-01-23 ENCOUNTER — Other Ambulatory Visit: Payer: Self-pay | Admitting: *Deleted

## 2019-01-23 DIAGNOSIS — C50412 Malignant neoplasm of upper-outer quadrant of left female breast: Secondary | ICD-10-CM

## 2019-01-24 ENCOUNTER — Encounter: Payer: Self-pay | Admitting: Oncology

## 2019-01-24 ENCOUNTER — Inpatient Hospital Stay (HOSPITAL_BASED_OUTPATIENT_CLINIC_OR_DEPARTMENT_OTHER): Payer: BC Managed Care – PPO | Admitting: Oncology

## 2019-01-24 ENCOUNTER — Inpatient Hospital Stay: Payer: BC Managed Care – PPO | Attending: Internal Medicine

## 2019-01-24 DIAGNOSIS — Z170 Estrogen receptor positive status [ER+]: Secondary | ICD-10-CM

## 2019-01-24 DIAGNOSIS — C50412 Malignant neoplasm of upper-outer quadrant of left female breast: Secondary | ICD-10-CM

## 2019-01-24 NOTE — Progress Notes (Signed)
No show

## 2019-03-02 ENCOUNTER — Other Ambulatory Visit: Payer: Self-pay | Admitting: Oncology

## 2019-03-02 DIAGNOSIS — Z9889 Other specified postprocedural states: Secondary | ICD-10-CM

## 2019-03-14 ENCOUNTER — Ambulatory Visit
Admission: RE | Admit: 2019-03-14 | Discharge: 2019-03-14 | Disposition: A | Discharge status: Home / Self Care | Payer: BC Managed Care – PPO | Source: Ambulatory Visit | Attending: Oncology | Admitting: Oncology

## 2019-03-14 ENCOUNTER — Other Ambulatory Visit: Payer: Self-pay

## 2019-03-14 DIAGNOSIS — Z9889 Other specified postprocedural states: Secondary | ICD-10-CM

## 2019-03-17 ENCOUNTER — Other Ambulatory Visit: Payer: Self-pay | Admitting: Oncology

## 2019-04-16 IMAGING — MG MM BREAST BX W LOC DEV EA AD LESION IMG BX SPEC STEREO GUIDE*L*
8 of 14 series · 8 of 18 positions shown · non-contrast
Comparison: Previous exams.

CLINICAL DATA: 67-year-old female presenting for stereotactic
biopsy of a second site of calcifications in the upper inner left
breast. These were identified on the post biopsy mammogram, and
therefore the patient was brought back for a second site biopsy.

EXAM:
LEFT BREAST STEREOTACTIC CORE NEEDLE BIOPSY

[L (1 of 6)]
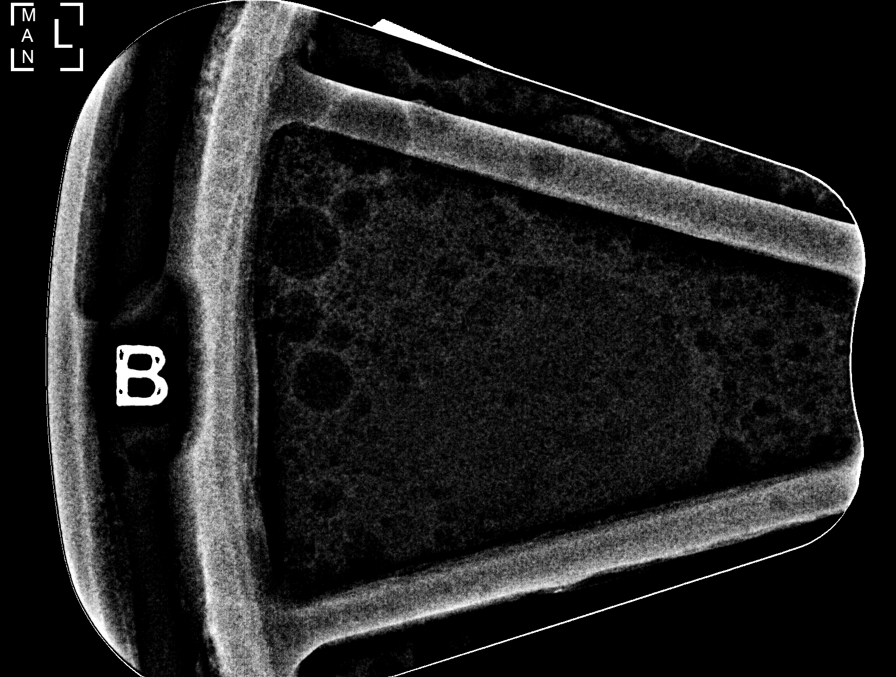

[L (2 of 6)]
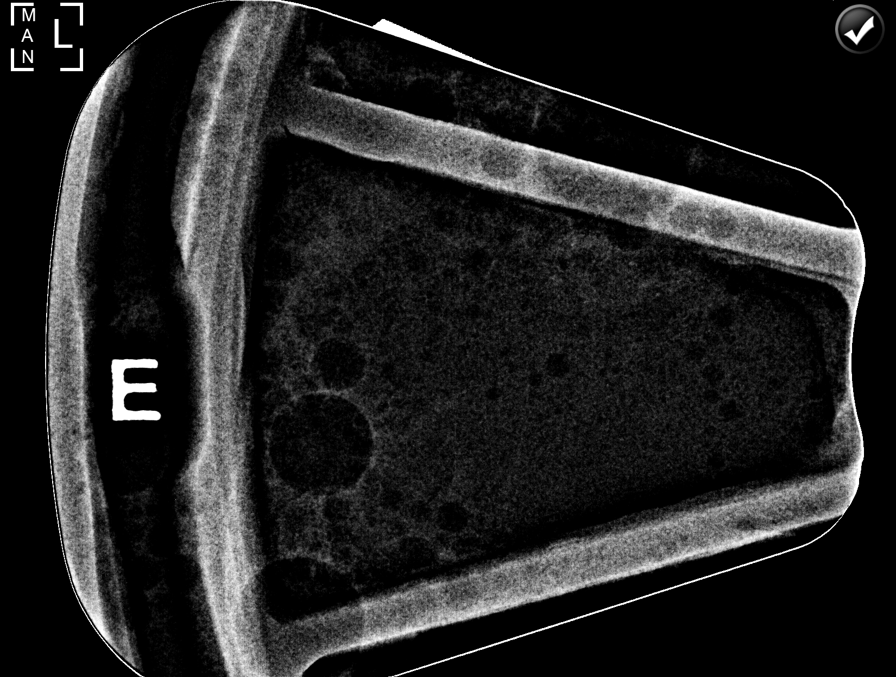

[L (3 of 6)]
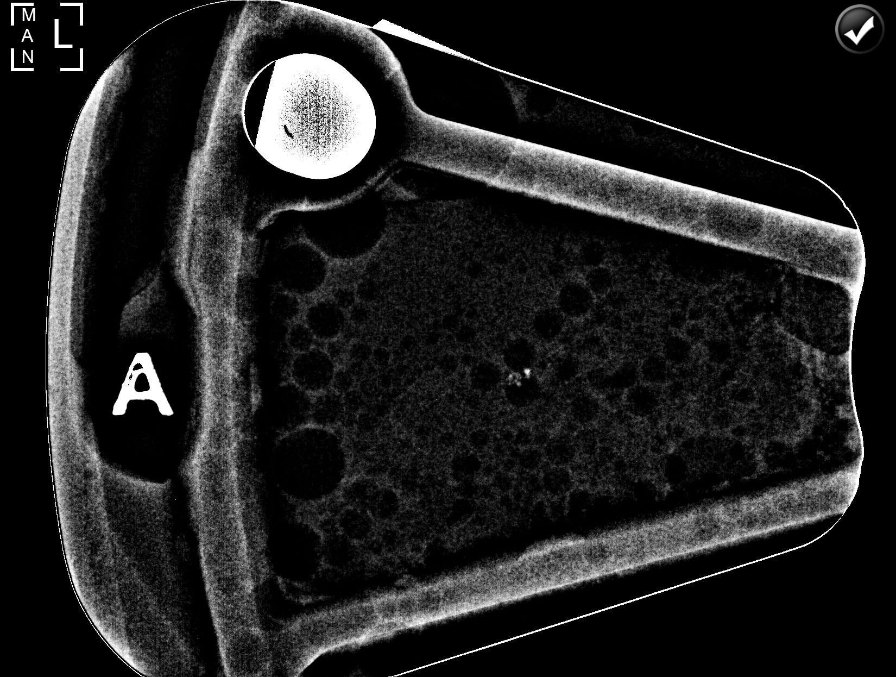

[L (4 of 6)]
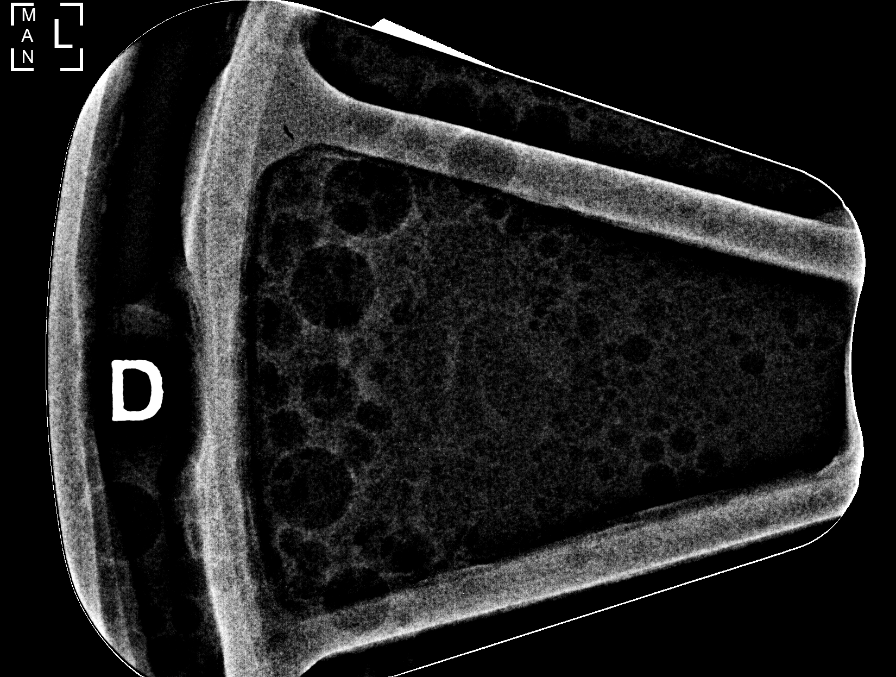

[L (5 of 6)]
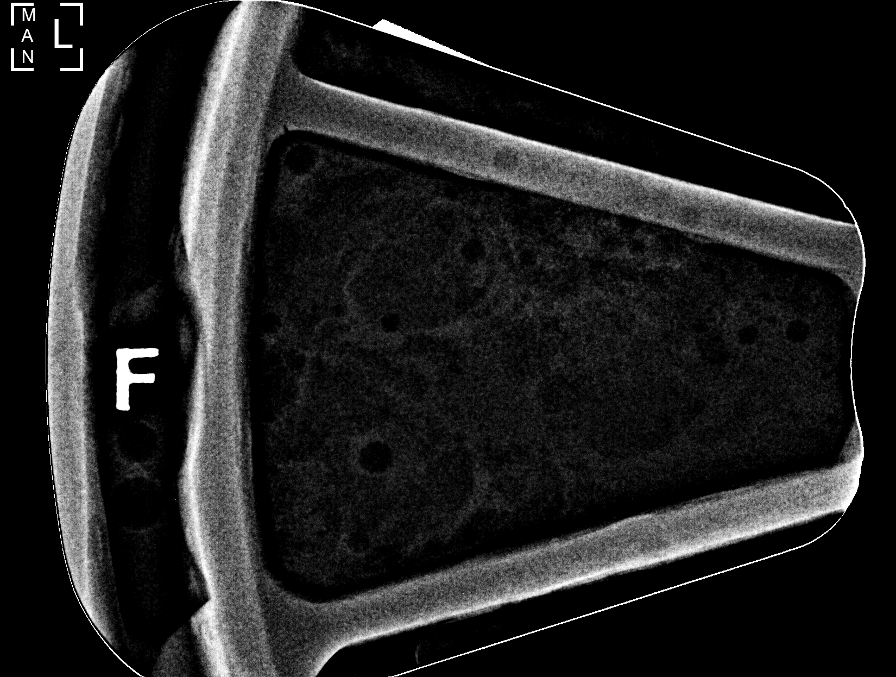

[L (6 of 6)]
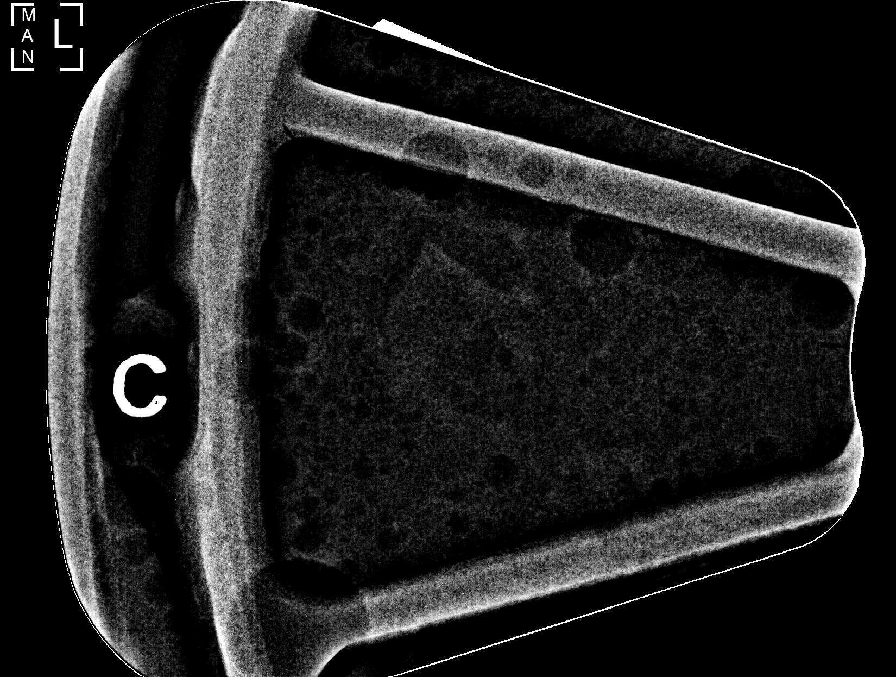

[L ML (1 of 2)]
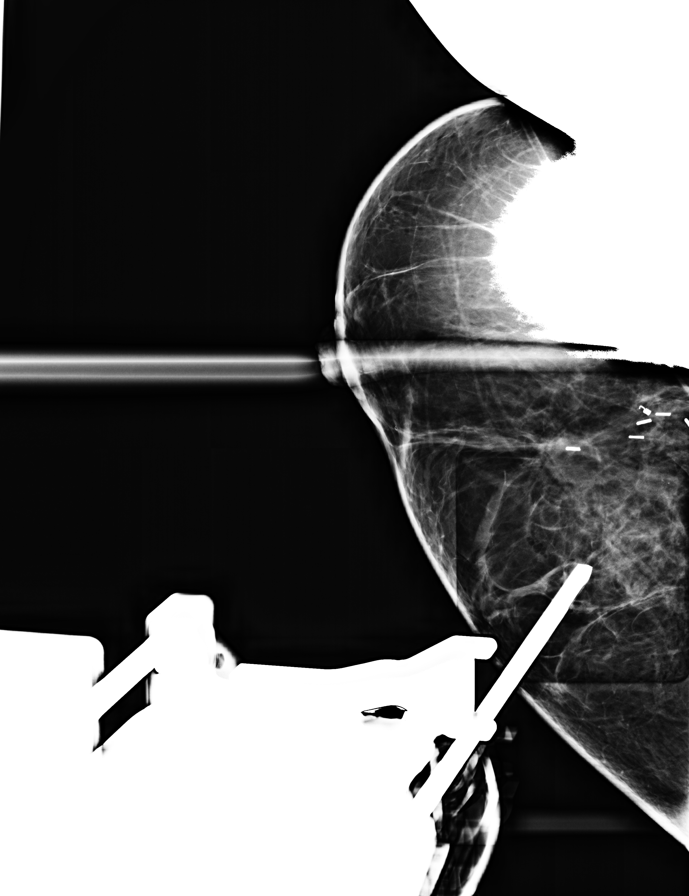

[L ML (2 of 2)]
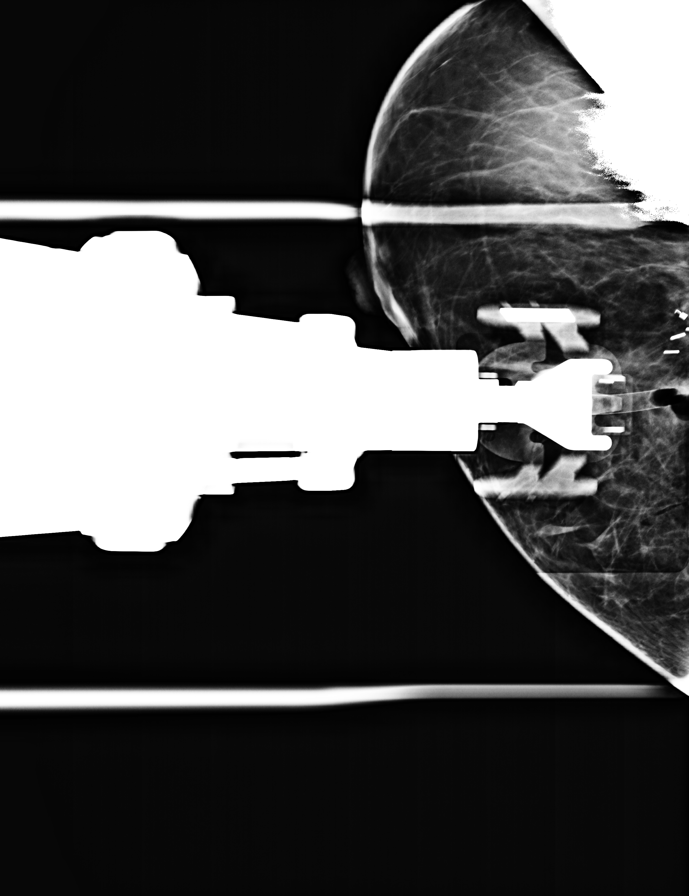

[8 of 18 positions shown; findings below may reference images not displayed]



Using sterile technique and 1% Lidocaine as local anesthetic, under
stereotactic guidance, a 9 gauge vacuum assisted device was used to
perform core needle biopsy of calcifications in the upper inner
quadrant of the left breast, superior to the lumpectomy site using a
medial approach. Specimen radiograph was performed showing
calcifications within 2 of the core samples. Specimens with
calcifications are identified for pathology.

Lesion quadrant: Upper-inner quadrant

At the conclusion of the procedure, a X shaped tissue marker clip
was deployed into the biopsy cavity. Follow-up 2-view mammogram was
performed and dictated separately.
IMPRESSION: 1. Stereotactic-guided biopsy of calcifications in the upper inner
left breast, above the lumpectomy site. No apparent complications.

2. Today, she also had a stereotactic biopsy of calcifications in
the upper inner left breast below the lumpectomy site. This was
dictated in a separate report.

## 2019-06-22 ENCOUNTER — Other Ambulatory Visit: Payer: Self-pay | Admitting: Oncology

## 2019-06-22 ENCOUNTER — Telehealth: Payer: Self-pay | Admitting: Oncology

## 2019-06-22 NOTE — Telephone Encounter (Signed)
Scheduled appt per 12/10 sch message - pt is aware of appt date and time   

## 2019-07-04 NOTE — Progress Notes (Signed)
Kristin Arroyo  Telephone:(336) [phone] Fax:(336) [phone]     ID: Kristin Arroyo DOB: 01/12/1951  MR#: [id number]  [id number]  Patient Care Team: Prince Solian, MD as PCP - General (Internal Medicine) Excell Seltzer, MD (Inactive) as Consulting Physician (General Surgery) Davyn Elsasser, Virgie Dad, MD as Consulting Physician (Oncology) Gery Pray, MD as Consulting Physician (Radiation Oncology) Azucena Fallen, MD as Consulting Physician (Obstetrics and Gynecology) Valinda Party, MD (Rheumatology) Brion Aliment, RN as Registered Nurse Causey, Charlestine Massed, NP as Nurse Practitioner (Hematology and Oncology) OTHER MD:  CHIEF COMPLAINT: Ductal carcinoma in situ  CURRENT TREATMENT: Anastrozole   INTERVAL HISTORY: Kristin Arroyo returns today for a follow-up of her noninvasive breast cancer.   She continues on anastrozole, with good tolerance.  Occasionally at night he will feel a little warm but she does not have drenching night sweats.  She has minimal vaginal dryness which is really not changed from baseline.  Since her last visit, she underwent bilateral diagnostic mammography with tomography at [facility] on 01/17/2018 showing: breast density category B; indeterminate 4 mm group of heterogeneous calcifications in the upper slightly inner left breast; no evidence of malignancy in the right breast.  She proceeded to biopsy of the calcifications in question on 01/19/2018. Pathology from the procedure ([id number]) showed fibrocystic changed with adenosis and calcifications, no evidence of malignancy.  She also underwent bone density testing on 07/04/2018. This showed a T-score of -1.1, which is considered osteopenic.  She also underwent bilateral diagnostic mammography with tomography at [facility] on 03/14/2019 showing: breast density category B; no evidence of malignancy in either breast.   REVIEW OF SYSTEMS Kristin Arroyo is working from home.  She is very  careful not to get exposed to the pandemic virus.  She has many questions regarding the vaccine because of her history of reactions and because her immune system of course is not "acting right".  Aside from these issues a detailed review of systems today was stable   BREAST CANCER HISTORY: From the original intake note:  The patient had routine screening mammography November 2017 showing an area of microcalcifications in the left breast. She was referred for diagnostic mammography at [facility] 06/17/2016 and this showed the breast density to be category B. In the upper-outer quadrant of the left breast there was an area of heterogeneous microcalcifications measuring 0.4 cm.  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  Her subsequent history is as detailed below.   PAST MEDICAL HISTORY: Past Medical History:  Diagnosis Date  . Anemia   . Anxiety   . Breast cancer ([place])    left breast  . Depression   . Diabetes mellitus without complication (HCC)    diet controlled  . Endometrial polyp 01/22/2017  . Fibroid uterus 01/22/2017  . GERD (gastroesophageal reflux disease)   . Hypertension   . Muscle spasm   . Postmenopause bleeding 01/22/2017  . Respiratory disorder    occasional bronchitis, wheezing  . Rheumatoid arthritis ([place])   . Sarcoid     PAST SURGICAL HISTORY: Past Surgical History:  Procedure Laterality Date  . BREAST BIOPSY Left 01/2018   benign  . BREAST LUMPECTOMY Left 08/2016  . BREAST LUMPECTOMY WITH RADIOACTIVE SEED LOCALIZATION Left 09/04/2016   Procedure: BREAST LUMPECTOMY WITH RADIOACTIVE SEED LOCALIZATION;  Surgeon: Marland Kitchen  Hoxworth, MD;  Location: [facility];  Service: General;  Laterality: Left;  . CESAREAN SECTION    .  COLONOSCOPY    . DILATATION & CURETTAGE/HYSTEROSCOPY WITH MYOSURE N/A 01/22/2017   Procedure: DILATATION & CURETTAGE/HYSTEROSCOPY WITH MYOSURE;  Surgeon: Azucena Fallen, MD;  Location: [facility];  Service: Gynecology;  Laterality: N/A;  . FOOT SURGERY    . LYMPH NODE BIOPSY     before 1976 by her neck to help diagnos sarcoidosis  . MOUTH SURGERY      FAMILY HISTORY Family History  Problem Relation Age of Onset  . Diabetes Mother   . Hypertension Mother   . Diabetes Father   . Hypertension Father   Patient's father died in his 51s from heart disease. The patient's mother experienced sudden death at age 48. The patient had 2 brothers, 1 sister. There is a family history of melanoma.   GYNECOLOGIC HISTORY:  No LMP recorded. Patient is postmenopausal. Menarche age 57, first live birth age 73. The patient is GX P1. She went through menopause in her early 47s. She did not take hormone replacement. She used oral contraceptives for some time in the remote past without complications.   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 At home it's just her "and Jesus", with no pets. Her daughter Kristin Arroyo lives in [place] him a and works in [employer] setting. The patient has no grandchildren. She attends a [employer]icist, medical church.     ADVANCED DIRECTIVES: Not in place   HEALTH MAINTENANCE: Social History   Tobacco Use  . Smoking status: Former Smoker    Quit date: 07/01/1974    Years since quitting: 45.0  . Smokeless tobacco: Never Used  Substance Use Topics  . Alcohol use: Yes    Comment: occ  . Drug use: No     Colonoscopy:2016/[facility]  PAP: 2017  Bone density: 05/14/2009 at [facility], T score 0.4 (normal)   Allergies  Allergen Reactions  . Remicade [Infliximab] Swelling  . Zithromax [Azithromycin] Hives    Previously had issue but last dose did not have any issues    Current Outpatient  Medications  Medication Sig Dispense Refill  . ADVAIR DISKUS 500-50 MCG/DOSE AEPB Inhale 1 puff into the lungs 2 (two) times daily.   0  . albuterol (PROVENTIL HFA;VENTOLIN HFA) 108 (90 Base) MCG/ACT inhaler Inhale 1-2 puffs into the lungs every 4 (four) hours as needed for wheezing or shortness of breath.    Marland Kitchen alendronate (FOSAMAX) 70 MG tablet Take 70 mg by mouth once a week. On Wednesdays  1  . anastrozole (ARIMIDEX) 1 MG tablet TAKE 1 TABLET BY MOUTH EVERY DAY 30 tablet 0  . atorvastatin (LIPITOR) 40 MG tablet Take 40 mg by mouth every morning.   1  . Calcium Carb-Cholecalciferol (CALCIUM 600 + D PO) Take 1 tablet by mouth daily.    . cholecalciferol (VITAMIN D) 1000 units tablet Take 1,000 Units by mouth daily.    . cyclobenzaprine (FLEXERIL) 5 MG tablet Take 1 tablet (5 mg total) by mouth 3 (three) times daily as needed. 20 tablet 0  . FIBER SELECT GUMMIES PO Take 1 each by mouth daily.    . folic acid (FOLVITE) 1 MG tablet Take 1 mg by mouth daily.    Marland Kitchen ibuprofen (MOTRIN IB) 200 MG tablet Take 3 tablets (600 mg total) by mouth every 8 (eight) hours as needed. 30 tablet 0  .  irbesartan (AVAPRO) 300 MG tablet Take 300 mg by mouth every morning.   1  . methotrexate (RHEUMATREX) 2.5 MG tablet Take 20 mg by mouth once a week. Takes on Thursdays Caution:Chemotherapy. Protect from light.    . Omega-3 1000 MG CAPS Take 1 capsule by mouth daily.    . Turmeric 500 MG CAPS Take 1 capsule by mouth daily.     No current facility-administered medications for this visit.    OBJECTIVE: Middle-aged African-American woman in no acute distress  Vitals:   07/05/19 1137  BP: (!) 143/73  Pulse: 86  Resp: 18  Temp: 98 F (36.7 C)  SpO2: 98%     Body mass index is 37.04 kg/m.    ECOG FS:1 - Symptomatic but completely ambulatory  Sclerae unicteric, EOMs intact Wearing a mask No cervical or supraclavicular adenopathy Lungs no rales or rhonchi Heart regular rate and rhythm Abd soft, nontender,  positive bowel sounds MSK no focal spinal tenderness, no upper extremity lymphedema Neuro: nonfocal, well oriented, appropriate affect Breasts: The right breast is unremarkable.  On the left side she is status post lumpectomy with excellent cosmetic results.  There is no evidence of local recurrence.  Both axillae are benign.   LAB RESULTS:  CMP     Component Value Date/Time   NA 139 01/18/2017 1525   NA 143 07/01/2016 0847   K 4.1 01/18/2017 1525   K 3.7 07/01/2016 0847   CL 105 01/18/2017 1525   CO2 27 01/18/2017 1525   CO2 29 07/01/2016 0847   GLUCOSE 128 (H) 01/18/2017 1525   GLUCOSE 124 07/01/2016 0847   BUN 14 01/18/2017 1525   BUN 7.5 07/01/2016 0847   CREATININE 0.90 01/18/2017 1525   CREATININE 0.7 07/01/2016 0847   CALCIUM 9.7 01/18/2017 1525   CALCIUM 9.1 07/01/2016 0847   PROT 7.1 07/01/2016 0847   ALBUMIN 3.4 (L) 07/01/2016 0847   AST 17 07/01/2016 0847   ALT 17 07/01/2016 0847   ALKPHOS 77 07/01/2016 0847   BILITOT 0.54 07/01/2016 0847   GFRNONAA >60 01/18/2017 1525   GFRAA >60 01/18/2017 1525    INo results found for: SPEP, UPEP  Lab Results  Component Value Date   WBC 5.4 01/18/2017   NEUTROABS 2.5 07/01/2016   HGB 12.8 01/18/2017   HCT 40.8 01/18/2017   MCV 78.0 01/18/2017   PLT 196 01/18/2017      Chemistry      Component Value Date/Time   NA 139 01/18/2017 1525   NA 143 07/01/2016 0847   K 4.1 01/18/2017 1525   K 3.7 07/01/2016 0847   CL 105 01/18/2017 1525   CO2 27 01/18/2017 1525   CO2 29 07/01/2016 0847   BUN 14 01/18/2017 1525   BUN 7.5 07/01/2016 0847   CREATININE 0.90 01/18/2017 1525   CREATININE 0.7 07/01/2016 0847      Component Value Date/Time   CALCIUM 9.7 01/18/2017 1525   CALCIUM 9.1 07/01/2016 0847   ALKPHOS 77 07/01/2016 0847   AST 17 07/01/2016 0847   ALT 17 07/01/2016 0847   BILITOT 0.54 07/01/2016 0847       No results found for: LABCA2  No components found for: LABCA125  No results for input(s): INR in  the last 168 hours.  Urinalysis No results found for: COLORURINE, APPEARANCEUR, LABSPEC, PHURINE, GLUCOSEU, HGBUR, BILIRUBINUR, KETONESUR, PROTEINUR, UROBILINOGEN, NITRITE, LEUKOCYTESUR   STUDIES: No results found.   ELIGIBLE FOR AVAILABLE RESEARCH PROTOCOL:  Decided against COMET  trial  ASSESSMENT: 68 y.o. Kristin Arroyo woman status post left breast upper outer quadrant biopsy 06/17/2016 for ductal carcinoma in situ, grade 2, estrogen and progesterone receptor positive  (1)  status post left lumpectomy 09/04/2016 for low-grade ductal carcinoma in situ, with negative margins.   (a) additional left breast biopsies 01/19/2018 showed only fibrocystic changes, no evidence of malignancy.  (2) started anastrozole 08/05/2016, discontinued after 3 days with unusual symptoms, which immediately resolved   (3) adjuvant radiation felt to only marginally improve her risk of local recurrence assuming she took anti-estrogens, with no effect on survival: patient opted against it  (4) resumed anastrozole March 2018  (a) bone density 07/04/2018 shows a T score of -1.1.  PLAN: Kristin Arroyo will soon be 3 years out from definitive surgery for her breast cancer with no evidence of disease recurrence.  This is very favorable.  She is tolerating anastrozole well.  Her bone density last year showed very minimal osteopenia.  The plan is to continue anastrozole for total of 5 years after which she will "graduate".  We discussed the coming vaccine and while she is understandably concerned that she might have a reaction, I think her risk of getting the virus is worse so when it does become available for her I suggested that she receive it but make sure that she receives that early in the week so that if she has a reaction she will be able to get appropriate treatment at her doctor's office without having to go to the emergency room  She knows to call for any other issue that may develop before the next  visit.   Kristin Arroyo, Virgie Dad, MD  07/05/19 11:59 AM Medical Oncology and Hematology [facility] [place], [place] [address] Tel. [phone]    Fax. [phone]   I, Wilburn Mylar, am acting as scribe for Dr. Virgie Dad. Kristin Arroyo.  I, Lurline Del MD, have reviewed the above documentation for accuracy and completeness, and I agree with the above.

## 2019-07-05 ENCOUNTER — Other Ambulatory Visit: Payer: Self-pay

## 2019-07-05 ENCOUNTER — Telehealth: Payer: Self-pay | Admitting: Oncology

## 2019-07-05 ENCOUNTER — Inpatient Hospital Stay: Payer: BC Managed Care – PPO | Attending: Oncology | Admitting: Oncology

## 2019-07-05 VITALS — BP 143/73 | HR 86 | Temp 98.0°F | Resp 18 | Ht 64.0 in | Wt 215.8 lb

## 2019-07-05 DIAGNOSIS — M8580 Other specified disorders of bone density and structure, unspecified site: Secondary | ICD-10-CM | POA: Insufficient documentation

## 2019-07-05 DIAGNOSIS — M069 Rheumatoid arthritis, unspecified: Secondary | ICD-10-CM

## 2019-07-05 DIAGNOSIS — Z79811 Long term (current) use of aromatase inhibitors: Secondary | ICD-10-CM | POA: Insufficient documentation

## 2019-07-05 DIAGNOSIS — D869 Sarcoidosis, unspecified: Secondary | ICD-10-CM | POA: Diagnosis not present

## 2019-07-05 DIAGNOSIS — Z170 Estrogen receptor positive status [ER+]: Secondary | ICD-10-CM | POA: Insufficient documentation

## 2019-07-05 DIAGNOSIS — D0512 Intraductal carcinoma in situ of left breast: Secondary | ICD-10-CM | POA: Insufficient documentation

## 2019-07-05 DIAGNOSIS — C50412 Malignant neoplasm of upper-outer quadrant of left female breast: Secondary | ICD-10-CM

## 2019-07-05 NOTE — Telephone Encounter (Signed)
I left a message regarding schedule  

## 2019-07-21 ENCOUNTER — Other Ambulatory Visit: Payer: Self-pay | Admitting: Oncology

## 2019-09-08 ENCOUNTER — Ambulatory Visit: Payer: BC Managed Care – PPO | Attending: Internal Medicine

## 2019-09-08 DIAGNOSIS — Z23 Encounter for immunization: Secondary | ICD-10-CM | POA: Insufficient documentation

## 2019-09-08 NOTE — Progress Notes (Signed)
   Covid-19 Vaccination Clinic  Name:  Disaya Kozloff    MRN: [id number] DOB: 03-05-1951  09/08/2019  Ms. Sigmund was observed post Covid-19 immunization for 30 minutes based on pre-vaccination screening without incidence. She was provided with Vaccine Information Sheet and instruction to access the V-Safe system.   Ms. Ratkovich was instructed to call 911 with any severe reactions post vaccine: Marland Kitchen Difficulty breathing  . Swelling of your face and throat  . A fast heartbeat  . A bad rash all over your body  . Dizziness and weakness    Immunizations Administered    Name Date Dose VIS Date Route   Pfizer COVID-19 Vaccine 09/08/2019  9:47 AM 0.3 mL 06/23/2019 Intramuscular   Manufacturer: [place]   Lot: [id number]   [place]: [id number]

## 2019-10-03 ENCOUNTER — Ambulatory Visit: Payer: BC Managed Care – PPO | Attending: Internal Medicine

## 2019-10-03 DIAGNOSIS — Z23 Encounter for immunization: Secondary | ICD-10-CM

## 2019-10-03 NOTE — Progress Notes (Signed)
   Covid-19 Vaccination Clinic  Name:  Mesa Mehrhoff    MRN: [id number] DOB: 09-13-1950  10/03/2019  Ms. Basara was observed post Covid-19 immunization for 30 minutes based on pre-vaccination screening without incident. She was provided with Vaccine Information Sheet and instruction to access the V-Safe system.   Ms. Toomes was instructed to call 911 with any severe reactions post vaccine: Marland Kitchen Difficulty breathing  . Swelling of face and throat  . A fast heartbeat  . A bad rash all over body  . Dizziness and weakness   Immunizations Administered    Name Date Dose VIS Date Route   Pfizer COVID-19 Vaccine 10/03/2019 11:37 AM 0.3 mL 06/23/2019 Intramuscular   Manufacturer: [place]   Lot: [id number]   [place]: [id number]

## 2020-02-18 ENCOUNTER — Other Ambulatory Visit: Payer: Self-pay | Admitting: Oncology

## 2020-02-20 ENCOUNTER — Other Ambulatory Visit: Payer: Self-pay | Admitting: Oncology

## 2020-02-20 DIAGNOSIS — Z9889 Other specified postprocedural states: Secondary | ICD-10-CM

## 2020-03-05 ENCOUNTER — Other Ambulatory Visit: Payer: Self-pay | Admitting: Internal Medicine

## 2020-03-05 DIAGNOSIS — Z13820 Encounter for screening for osteoporosis: Secondary | ICD-10-CM

## 2020-03-14 ENCOUNTER — Ambulatory Visit
Admission: RE | Admit: 2020-03-14 | Discharge: 2020-03-14 | Disposition: A | Discharge status: Home / Self Care | Payer: BC Managed Care – PPO | Source: Ambulatory Visit | Attending: Oncology | Admitting: Oncology

## 2020-03-14 ENCOUNTER — Other Ambulatory Visit: Payer: Self-pay

## 2020-03-14 DIAGNOSIS — Z9889 Other specified postprocedural states: Secondary | ICD-10-CM

## 2020-04-26 ENCOUNTER — Other Ambulatory Visit: Payer: Self-pay | Admitting: *Deleted

## 2020-04-26 DIAGNOSIS — C50412 Malignant neoplasm of upper-outer quadrant of left female breast: Secondary | ICD-10-CM

## 2020-04-28 NOTE — Progress Notes (Signed)
[name]  [phone] Fax:(336) [phone]     ID: Kristin Arroyo DOB: [birth year]  MR#: [id number]  TIR#:[id number]  Patient Care Team: Prince Solian, MD as PCP - General (Internal Medicine) Excell Seltzer, MD (Inactive) as Consulting Physician (General Surgery) Kyleena Scheirer, Virgie Dad, MD as Consulting Physician (Oncology) Gery Pray, MD as Consulting Physician (Radiation Oncology) Azucena Fallen, MD as Consulting Physician (Obstetrics and Gynecology) Valinda Party, MD (Rheumatology) Brion Aliment, RN as Registered Nurse Causey, Charlestine Massed, NP as Nurse Practitioner (Hematology and Oncology) OTHER MD:  CHIEF COMPLAINT: Ductal carcinoma in situ  CURRENT TREATMENT: Anastrozole   INTERVAL HISTORY: Kristin Arroyo was scheduled today for a follow-up of her noninvasive breast cancer, however she did not show    Her most recent bone density screening from 07/04/2018 showed a T-score of -1.1, which is considered osteopenic. She is already scheduled for repeat on 07/09/2020.  Since her last visit, she underwent bilateral diagnostic mammography with tomography at [facility] on 03/14/2020 showing: breast density category B; no evidence of malignancy in either breast.    REVIEW OF SYSTEMS [place]    BREAST CANCER HISTORY: From the original intake note:  The patient had routine screening mammography November 2017 showing an area of microcalcifications in the left breast. She was referred for diagnostic mammography at the [facility] 06/17/2016 and this showed the breast density to be category B. In the upper-outer quadrant of the left breast there was an area of heterogeneous microcalcifications measuring 0.4 cm.  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  Her subsequent history is as detailed below.   PAST MEDICAL HISTORY: Past Medical History:  Diagnosis Date  . Anemia   . Anxiety   . Breast cancer ([place])    left breast  . Depression   . Diabetes mellitus without complication (HCC)    diet controlled  . Endometrial polyp 01/22/2017  . Fibroid uterus 01/22/2017  . GERD (gastroesophageal reflux disease)   . Hypertension   . Muscle spasm   . Postmenopause bleeding 01/22/2017  . Respiratory disorder    occasional bronchitis, wheezing  . Rheumatoid arthritis ([place])   . Sarcoid     PAST SURGICAL HISTORY: Past Surgical History:  Procedure Laterality Date  . BREAST BIOPSY Left 01/2018   benign  . BREAST LUMPECTOMY Left 08/2016  . BREAST LUMPECTOMY WITH RADIOACTIVE SEED LOCALIZATION Left 09/04/2016   Procedure: BREAST LUMPECTOMY WITH RADIOACTIVE SEED LOCALIZATION;  Surgeon: Excell Seltzer, MD;  Location: [facility];  Service: General;  Laterality: Left;  . CESAREAN SECTION    . COLONOSCOPY    . DILATATION & CURETTAGE/HYSTEROSCOPY WITH MYOSURE N/A 01/22/2017   Procedure: DILATATION & CURETTAGE/HYSTEROSCOPY WITH MYOSURE;  Surgeon: Azucena Fallen, MD;  Location: [facility];  Service: Gynecology;  Laterality: N/A;  . FOOT SURGERY    . LYMPH NODE BIOPSY     before 1976 by her neck to help diagnos sarcoidosis  . MOUTH SURGERY      FAMILY HISTORY Family History  Problem Relation Age of Onset  . Diabetes Mother   . Hypertension Mother   . Diabetes Father   . Hypertension Father   Patient's father died in his 42s from heart disease. The patient's mother experienced sudden death at age 32. The patient had 2 brothers, 1 sister. There is  a family history of melanoma.   GYNECOLOGIC HISTORY:  No LMP recorded. Patient is postmenopausal. Menarche age 17, first live birth age 60. The patient is GX P1. She went through menopause in her early 51s. She did not take hormone replacement. She used oral  contraceptives for some time in the remote past without complications.   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 At home it's just her "and Jesus", with no pets. Her daughter [name] lives in [place] him a and works in [employer] setting. The patient has no grandchildren. She attends a Physicist, medical church.     ADVANCED DIRECTIVES: Not in place   HEALTH MAINTENANCE: Social History   Tobacco Use  . Smoking status: Former Smoker    Quit date: 07/01/1974    Years since quitting: 45.8  . Smokeless tobacco: Never Used  Substance Use Topics  . Alcohol use: Yes    Comment: occ  . Drug use: No     Colonoscopy:2016/[facility]  PAP: 2017  Bone density: 05/14/2009 at [facility], T score 0.4 (normal)   Allergies  Allergen Reactions  . Remicade [Infliximab] Swelling  . Zithromax [Azithromycin] Hives    Previously had issue but last dose did not have any issues    Current Outpatient Medications  Medication Sig Dispense Refill  . ADVAIR DISKUS 500-50 MCG/DOSE AEPB Inhale 1 puff into the lungs 2 (two) times daily.   0  . albuterol (PROVENTIL HFA;VENTOLIN HFA) 108 (90 Base) MCG/ACT inhaler Inhale 1-2 puffs into the lungs every 4 (four) hours as needed for wheezing or shortness of breath.    Marland Kitchen alendronate (FOSAMAX) 70 MG tablet Take 70 mg by mouth once a week. On Wednesdays  1  . anastrozole (ARIMIDEX) 1 MG tablet TAKE 1 TABLET BY MOUTH EVERY DAY 30 tablet 3  . atorvastatin (LIPITOR) 40 MG tablet Take 40 mg by mouth every morning.   1  . Calcium Carb-Cholecalciferol (CALCIUM 600 + D PO) Take 1 tablet by mouth daily.    . cholecalciferol (VITAMIN D) 1000 units tablet Take 1,000 Units by mouth daily.    . cyclobenzaprine (FLEXERIL) 5 MG tablet Take 1 tablet (5 mg total) by mouth 3 (three) times daily as needed. 20 tablet 0  . FIBER SELECT GUMMIES PO Take 1 each by mouth daily.    . folic acid (FOLVITE) 1  MG tablet Take 1 mg by mouth daily.    Marland Kitchen ibuprofen (MOTRIN IB) 200 MG tablet Take 3 tablets (600 mg total) by mouth every 8 (eight) hours as needed. 30 tablet 0  . irbesartan (AVAPRO) 300 MG tablet Take 300 mg by mouth every morning.   1  . methotrexate (RHEUMATREX) 2.5 MG tablet Take 20 mg by mouth once a week. Takes on Thursdays Caution:Chemotherapy. Protect from light.    . Omega-3 1000 MG CAPS Take 1 capsule by mouth daily.    . Turmeric 500 MG CAPS Take 1 capsule by mouth daily.     No current facility-administered medications for this visit.    OBJECTIVE:  There were no vitals filed for this visit.   There is no height or weight on file to calculate BMI.    ECOG FS:    LAB RESULTS:  CMP     Component Value Date/Time   NA 139 01/18/2017 1525   NA 143 07/01/2016 0847   K 4.1 01/18/2017 1525   K 3.7 07/01/2016 0847  CL 105 01/18/2017 1525   CO2 27 01/18/2017 1525   CO2 29 07/01/2016 0847   GLUCOSE 128 (H) 01/18/2017 1525   GLUCOSE 124 07/01/2016 0847   BUN 14 01/18/2017 1525   BUN 7.5 07/01/2016 0847   CREATININE 0.90 01/18/2017 1525   CREATININE 0.7 07/01/2016 0847   CALCIUM 9.7 01/18/2017 1525   CALCIUM 9.1 07/01/2016 0847   PROT 7.1 07/01/2016 0847   ALBUMIN 3.4 (L) 07/01/2016 0847   AST 17 07/01/2016 0847   ALT 17 07/01/2016 0847   ALKPHOS 77 07/01/2016 0847   BILITOT 0.54 07/01/2016 0847   GFRNONAA >60 01/18/2017 1525   GFRAA >60 01/18/2017 1525    INo results found for: SPEP, UPEP  Lab Results  Component Value Date   WBC 5.4 01/18/2017   NEUTROABS 2.5 07/01/2016   HGB 12.8 01/18/2017   HCT 40.8 01/18/2017   MCV 78.0 01/18/2017   PLT 196 01/18/2017      Chemistry      Component Value Date/Time   NA 139 01/18/2017 1525   NA 143 07/01/2016 0847   K 4.1 01/18/2017 1525   K 3.7 07/01/2016 0847   CL 105 01/18/2017 1525   CO2 27 01/18/2017 1525   CO2 29 07/01/2016 0847   BUN 14 01/18/2017 1525   BUN 7.5 07/01/2016 0847   CREATININE 0.90  01/18/2017 1525   CREATININE 0.7 07/01/2016 0847      Component Value Date/Time   CALCIUM 9.7 01/18/2017 1525   CALCIUM 9.1 07/01/2016 0847   ALKPHOS 77 07/01/2016 0847   AST 17 07/01/2016 0847   ALT 17 07/01/2016 0847   BILITOT 0.54 07/01/2016 0847       No results found for: LABCA2  No components found for: LABCA125  No results for input(s): INR in the last 168 hours.  Urinalysis No results found for: COLORURINE, APPEARANCEUR, LABSPEC, PHURINE, GLUCOSEU, HGBUR, BILIRUBINUR, KETONESUR, PROTEINUR, UROBILINOGEN, NITRITE, LEUKOCYTESUR   STUDIES: No results found.   ELIGIBLE FOR AVAILABLE RESEARCH PROTOCOL:  Decided against COMET  trial  ASSESSMENT: 69 y.o. [place] woman status post left breast upper outer quadrant biopsy 06/17/2016 for ductal carcinoma in situ, grade 2, estrogen and progesterone receptor positive  (1)  status post left lumpectomy 09/04/2016 for low-grade ductal carcinoma in situ, with negative margins.   (a) additional left breast biopsies 01/19/2018 showed only fibrocystic changes, no evidence of malignancy.  (2) started anastrozole 08/05/2016, discontinued after 3 days with unusual symptoms, which immediately resolved   (3) adjuvant radiation felt to only marginally improve her risk of local recurrence assuming she took anti-estrogens, with no effect on survival: patient opted against it  (4) resumed anastrozole March 2018  (a) bone density 07/04/2018 shows a T score of -1.1.   PLAN: Santiana did not show for 04/29/2020 visit.  Follow-up letter has been sent Keldon Lassen, Virgie Dad, MD  04/29/20 7:09 PM Medical Oncology and Hematology [facility], [place] [address] Tel. [phone]    Fax. [phone]   I, Wilburn Mylar, am acting as scribe for Dr. Virgie Dad. Song [place]arris.  I, Lurline Del MD, have reviewed the above documentation for accuracy and completeness, and I agree with the above.   *Total Encounter  Time as defined by the Centers for Medicare and Medicaid Services includes, in addition to the face-to-face time of a patient visit (documented in the note above) non-face-to-face time: obtaining and reviewing outside history, ordering and reviewing medications, tests or procedures, care coordination (communications with  other health care professionals or caregivers) and documentation in the medical record.

## 2020-04-29 ENCOUNTER — Encounter: Payer: Self-pay | Admitting: Oncology

## 2020-04-29 ENCOUNTER — Inpatient Hospital Stay: Payer: BC Managed Care – PPO

## 2020-04-29 ENCOUNTER — Inpatient Hospital Stay: Payer: BC Managed Care – PPO | Attending: Oncology | Admitting: Oncology

## 2020-04-29 DIAGNOSIS — C50412 Malignant neoplasm of upper-outer quadrant of left female breast: Secondary | ICD-10-CM

## 2020-05-08 ENCOUNTER — Telehealth: Payer: Self-pay | Admitting: Oncology

## 2020-05-08 NOTE — Telephone Encounter (Signed)
Called pt per 10/25 sch msg - unable to reach pt .left message for patient to call back to reschedule appt.

## 2020-05-16 ENCOUNTER — Inpatient Hospital Stay: Payer: BC Managed Care – PPO | Attending: Oncology

## 2020-05-16 ENCOUNTER — Inpatient Hospital Stay (HOSPITAL_BASED_OUTPATIENT_CLINIC_OR_DEPARTMENT_OTHER): Payer: BC Managed Care – PPO | Admitting: Adult Health

## 2020-05-16 ENCOUNTER — Other Ambulatory Visit: Payer: Self-pay

## 2020-05-16 ENCOUNTER — Encounter: Payer: Self-pay | Admitting: Adult Health

## 2020-05-16 VITALS — BP 144/96 | HR 65 | Resp 22 | Ht 64.0 in | Wt 217.4 lb

## 2020-05-16 DIAGNOSIS — Z79811 Long term (current) use of aromatase inhibitors: Secondary | ICD-10-CM | POA: Insufficient documentation

## 2020-05-16 DIAGNOSIS — C50412 Malignant neoplasm of upper-outer quadrant of left female breast: Secondary | ICD-10-CM

## 2020-05-16 DIAGNOSIS — Z170 Estrogen receptor positive status [ER+]: Secondary | ICD-10-CM | POA: Insufficient documentation

## 2020-05-16 NOTE — Progress Notes (Signed)
[place]  [phone]) [phone] [phone]) [phone]     ID: Kristin Arroyo DOB: 06/19/1951  MR#: [id number]  HQI#:[id number]  Patient Care Team: Prince Solian, MD as PCP - General (Internal Medicine) Excell Seltzer, MD (Inactive) as Consulting Physician (General Surgery) Magrinat, Virgie Dad, MD as Consulting Physician (Oncology) Gery Pray, MD as Consulting Physician (Radiation Oncology) Azucena Fallen, MD as Consulting Physician (Obstetrics and Gynecology) Valinda Party, MD (Rheumatology) Brion Aliment, RN as Registered Nurse Sahian Kerney, Charlestine Massed, NP as Nurse Practitioner (Hematology and Oncology) OTHER MD:  CHIEF COMPLAINT: Ductal carcinoma in situ  CURRENT TREATMENT: Anastrozole   INTERVAL HISTORY: Kristin Arroyo was scheduled today for a follow-up of her noninvasive breast cancer.  Her most recent bone density screening from 07/04/2018 showed a T-score of -1.1, which is considered osteopenic. She is already scheduled for repeat on 07/09/2020.  Since her last visit, she underwent bilateral diagnostic mammography with tomography at [facility] on 03/14/2020 showing: breast density category B; no evidence of malignancy in either breast.    REVIEW OF SYSTEMS Kristin Arroyo is doing quite well today.  She denies any new issues.  She is not exercising but plans to get back into exercising.  She is taking Anastrozole with good tolerance.  She does note some hair thinning, and it is so severe that she is having to have a wig made.  She has no breast concerns.  A detailed ROS was otherwise non contributory.   BREAST CANCER HISTORY: From the original intake note:  The patient had routine screening mammography November 2017 showing an area of microcalcifications in the left breast. She was referred for diagnostic mammography at the [facility] 06/17/2016 and this showed the breast density to be category B. In the upper-outer quadrant of the left breast there was an  area of heterogeneous microcalcifications measuring 0.4 cm.  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  Her subsequent history is as detailed below.   PAST MEDICAL HISTORY: Past Medical History:  Diagnosis Date  . Anemia   . Anxiety   . Breast cancer ([place])    left breast  . Depression   . Diabetes mellitus without complication (HCC)    diet controlled  . Endometrial polyp 01/22/2017  . Fibroid uterus 01/22/2017  . GERD (gastroesophageal reflux disease)   . Hypertension   . Muscle spasm   . Postmenopause bleeding 01/22/2017  . Respiratory disorder    occasional bronchitis, wheezing  . Rheumatoid arthritis ([place])   . Sarcoid     PAST SURGICAL HISTORY: Past Surgical History:  Procedure Laterality Date  . BREAST BIOPSY Left 01/2018   benign  . BREAST LUMPECTOMY Left 08/2016  . BREAST LUMPECTOMY WITH RADIOACTIVE SEED LOCALIZATION Left 09/04/2016   Procedure: BREAST LUMPECTOMY WITH RADIOACTIVE SEED LOCALIZATION;  Surgeon: Excell Seltzer, MD;  Location: [facility];  Service: General;  Laterality: Left;  . CESAREAN SECTION    . COLONOSCOPY    . DILATATION & CURETTAGE/HYSTEROSCOPY WITH MYOSURE N/A 01/22/2017   Procedure: DILATATION & CURETTAGE/HYSTEROSCOPY WITH MYOSURE;  Surgeon: Azucena Fallen, MD;  Location: [facility];  Service: Gynecology;  Laterality: N/A;  . FOOT SURGERY    . LYMPH NODE BIOPSY     before 1976 by her neck to help diagnos sarcoidosis  . MOUTH SURGERY  FAMILY HISTORY Family History  Problem Relation Age of Onset  . Diabetes Mother   . Hypertension Mother   . Diabetes Father   . Hypertension Father   Patient's father died in his 19s from heart disease. The patient's mother experienced sudden death at age 78. The patient had 2  brothers, 1 sister. There is a family history of melanoma.   GYNECOLOGIC HISTORY:  No LMP recorded. Patient is postmenopausal. Menarche age 20, first live birth age 66. The patient is GX P1. She went through menopause in her early 45s. She did not take hormone replacement. She used oral contraceptives for some time in the remote past without complications.   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 At home it's just her "and Jesus", with no pets. Her daughter Kristin Arroyo lives in [place] him a and works in [employer] setting. The patient has no grandchildren. She attends a [employer], medical church.     ADVANCED DIRECTIVES: Not in place   HEALTH MAINTENANCE: Social History   Tobacco Use  . Smoking status: Former Smoker    Quit date: 07/01/1974    Years since quitting: 45.9  . Smokeless tobacco: Never Used  Substance Use Topics  . Alcohol use: Yes    Comment: occ  . Drug use: No     Colonoscopy:2016/[facility]  PAP: 2017  Bone density: 05/14/[facility], T score 0.4 (normal)   Allergies  Allergen Reactions  . Remicade [Infliximab] Swelling  . Zithromax [Azithromycin] Hives    Previously had issue but last dose did not have any issues    Current Outpatient Medications  Medication Sig Dispense Refill  . ADVAIR DISKUS 500-50 MCG/DOSE AEPB Inhale 1 puff into the lungs 2 (two) times daily.   0  . albuterol (PROVENTIL HFA;VENTOLIN HFA) 108 (90 Base) MCG/ACT inhaler Inhale 1-2 puffs into the lungs every 4 (four) hours as needed for wheezing or shortness of breath.    Marland Kitchen alendronate (FOSAMAX) 70 MG tablet Take 70 mg by mouth once a week. On Wednesdays  1  . anastrozole (ARIMIDEX) 1 MG tablet TAKE 1 TABLET BY MOUTH EVERY DAY 30 tablet 3  . atorvastatin (LIPITOR) 40 MG tablet Take 40 mg by mouth every morning.   1  . Calcium Carb-Cholecalciferol (CALCIUM 600 + D PO) Take 1 tablet by mouth daily.      . cholecalciferol (VITAMIN D) 1000 units tablet Take 1,000 Units by mouth daily.    . cyclobenzaprine (FLEXERIL) 5 MG tablet Take 1 tablet (5 mg total) by mouth 3 (three) times daily as needed. 20 tablet 0  . FIBER SELECT GUMMIES PO Take 1 each by mouth daily.    . folic acid (FOLVITE) 1 MG tablet Take 1 mg by mouth daily.    Marland Kitchen ibuprofen (MOTRIN IB) 200 MG tablet Take 3 tablets (600 mg total) by mouth every 8 (eight) hours as needed. 30 tablet 0  . irbesartan (AVAPRO) 300 MG tablet Take 300 mg by mouth every morning.   1  . methotrexate (RHEUMATREX) 2.5 MG tablet Take 20 mg by mouth once a week. Takes on Thursdays Caution:Chemotherapy. Protect from light.    . Omega-3 1000 MG CAPS Take 1 capsule by mouth daily.    . Turmeric 500 MG CAPS Take 1 capsule by mouth daily.     No current facility-administered medications for this visit.    OBJECTIVE:  Vitals:   05/16/20 1011  BP: Marland Kitchen)  144/96  Pulse: 65  Resp: (!) 22  SpO2: 100%     Body mass index is 37.32 kg/m.    ECOG FS: GENERAL: Patient is a well appearing female in no acute distress HEENT:  Sclerae anicteric.  Mask in place.. Neck is supple.  NODES:  No cervical, supraclavicular, or axillary lymphadenopathy palpated.  BREAST EXAM:  Left breast s/p lumpectomy, no sing of local recurrence, right breast benign LUNGS:  Clear to auscultation bilaterally.  No wheezes or rhonchi. HEART:  Regular rate and rhythm. No murmur appreciated. ABDOMEN:  Soft, nontender.  Positive, normoactive bowel sounds. No organomegaly palpated. MSK:  No focal spinal tenderness to palpation. Full range of motion bilaterally in the upper extremities. EXTREMITIES:  No peripheral edema.   SKIN:  Clear with no obvious rashes or skin changes. No nail dyscrasia. NEURO:  Nonfocal. Well oriented.  Appropriate affect.    LAB RESULTS:  CMP     Component Value Date/Time   NA 139 01/18/2017 1525   NA 143 07/01/2016 0847   K 4.1 01/18/2017 1525   K 3.7 07/01/2016  0847   CL 105 01/18/2017 1525   CO2 27 01/18/2017 1525   CO2 29 07/01/2016 0847   GLUCOSE 128 (H) 01/18/2017 1525   GLUCOSE 124 07/01/2016 0847   BUN 14 01/18/2017 1525   BUN 7.5 07/01/2016 0847   CREATININE 0.90 01/18/2017 1525   CREATININE 0.7 07/01/2016 0847   CALCIUM 9.7 01/18/2017 1525   CALCIUM 9.1 07/01/2016 0847   PROT 7.1 07/01/2016 0847   ALBUMIN 3.4 (L) 07/01/2016 0847   AST 17 07/01/2016 0847   ALT 17 07/01/2016 0847   ALKPHOS 77 07/01/2016 0847   BILITOT 0.54 07/01/2016 0847   GFRNONAA >60 01/18/2017 1525   GFRAA >60 01/18/2017 1525    INo results found for: SPEP, UPEP  Lab Results  Component Value Date   WBC 5.4 01/18/2017   NEUTROABS 2.5 07/01/2016   HGB 12.8 01/18/2017   HCT 40.8 01/18/2017   MCV 78.0 01/18/2017   PLT 196 01/18/2017      Chemistry      Component Value Date/Time   NA 139 01/18/2017 1525   NA 143 07/01/2016 0847   K 4.1 01/18/2017 1525   K 3.7 07/01/2016 0847   CL 105 01/18/2017 1525   CO2 27 01/18/2017 1525   CO2 29 07/01/2016 0847   BUN 14 01/18/2017 1525   BUN 7.5 07/01/2016 0847   CREATININE 0.90 01/18/2017 1525   CREATININE 0.7 07/01/2016 0847      Component Value Date/Time   CALCIUM 9.7 01/18/2017 1525   CALCIUM 9.1 07/01/2016 0847   ALKPHOS 77 07/01/2016 0847   AST 17 07/01/2016 0847   ALT 17 07/01/2016 0847   BILITOT 0.54 07/01/2016 0847       No results found for: LABCA2  No components found for: LABCA125  No results for input(s): INR in the last 168 hours.  Urinalysis No results found for: COLORURINE, APPEARANCEUR, LABSPEC, PHURINE, GLUCOSEU, HGBUR, BILIRUBINUR, KETONESUR, PROTEINUR, UROBILINOGEN, NITRITE, LEUKOCYTESUR   STUDIES: No results found.   ELIGIBLE FOR AVAILABLE RESEARCH PROTOCOL:  Decided against COMET  trial  ASSESSMENT: 69 y.o. Kristin Arroyo woman status post left breast upper outer quadrant biopsy 06/17/2016 for ductal carcinoma in situ, grade 2, estrogen and progesterone receptor  positive  (1)  status post left lumpectomy 09/04/2016 for low-grade ductal carcinoma in situ, with negative margins.   (a) additional left breast biopsies 01/19/2018 showed only fibrocystic changes, no evidence  of malignancy.  (2) started anastrozole 08/05/2016, discontinued after 3 days with unusual symptoms, which immediately resolved   (3) adjuvant radiation felt to only marginally improve her risk of local recurrence assuming she took anti-estrogens, with no effect on survival: patient opted against it  (4) resumed anastrozole March 2018  (a) bone density 07/04/2018 shows a T score of -1.1.   PLAN: Alaiah is doing quite well today.  She has no signs of breast cancer recurrence.  She will continue on Anastrozole as she is taking it with good tolerance.  She is having a significant issue with hair thinning secondary to the anastrozole.  She is having a wig made, and I let her know I would be happy to write a script for this so it will be covered.    Jadynn was recommended healthy diet and exercise.  I recommended she continue to f/u with her PCP for her health maintenance.  We will see her back in 1 year for f/u.  She knows to call for any questions that may arise between now and her next appointment.  We are happy to see her sooner if needed.   Total encounter time: 20 minutes*  Wilber Bihari, NP 05/16/20 11:09 AM Medical Oncology and Hematology [facility] [place], [place] [address] Tel. [phone]    Fax. [phone]    *Total Encounter Time as defined by the Centers for Medicare and Medicaid Services includes, in addition to the face-to-face time of a patient visit (documented in the note above) non-face-to-face time: obtaining and reviewing outside history, ordering and reviewing medications, tests or procedures, care coordination (communications with other health care professionals or caregivers) and documentation in the medical record.

## 2020-06-27 ENCOUNTER — Other Ambulatory Visit: Payer: Self-pay | Admitting: Oncology

## 2020-07-09 ENCOUNTER — Ambulatory Visit
Admission: RE | Admit: 2020-07-09 | Discharge: 2020-07-09 | Disposition: A | Discharge status: Home / Self Care | Payer: BC Managed Care – PPO | Source: Ambulatory Visit | Attending: Internal Medicine | Admitting: Internal Medicine

## 2020-07-09 DIAGNOSIS — Z13820 Encounter for screening for osteoporosis: Secondary | ICD-10-CM

## 2020-07-24 ENCOUNTER — Other Ambulatory Visit (HOSPITAL_COMMUNITY): Payer: Self-pay | Admitting: Family

## 2020-07-24 ENCOUNTER — Telehealth: Payer: Self-pay | Admitting: Family

## 2020-07-24 ENCOUNTER — Telehealth (HOSPITAL_COMMUNITY): Payer: Self-pay | Admitting: Pharmacist

## 2020-07-24 MED ORDER — NIRMATRELVIR/RITONAVIR (PAXLOVID)TABLET: 3.0000 | ORAL_TABLET | Freq: Two times a day (BID) | ORAL | 0 refills | Status: AC

## 2020-07-24 MED FILL — PAXLOVID 20 X 150 MG & 10 X: 20 X 150 MG | 5 days supply | Qty: 30 | Fill #0

## 2020-07-24 NOTE — Telephone Encounter (Signed)
Called to discuss with patient about COVID-19 symptoms and the use of one of the available treatments for those with mild to moderate Covid symptoms and at a high risk of hospitalization.  Pt appears to qualify for outpatient treatment due to co-morbid conditions and/or a member of an at-risk group in accordance with the FDA Emergency Use Authorization.    Symptom onset: 07/21/20 Vaccinated: Yes  Booster? No Qualifiers: immunocompromised, HTN, ethnicity, BMI, asthma Creatinine clearance: 75  Unable to reach pt - Left Vm requesting call back.  Loel Dubonnet

## 2020-07-24 NOTE — Telephone Encounter (Signed)
Outpatient Oral COVID Treatment Note  I connected with Kristin Arroyo on 07/24/2020/1:43 PM by telephone and verified that I am speaking with the correct person using two identifiers.  I discussed the limitations, risks, security, and privacy concerns of performing an evaluation and management service by telephone and the availability of in person appointments. I also discussed with the patient that there may be a patient responsible charge related to this service. The patient expressed understanding and agreed to proceed.  Patient location: [place] home Provider location: Home  Diagnosis: COVID-19 infection  Purpose of visit: Discussion of potential use of Molnupiravir or Paxlovid, a new treatment for mild to moderate COVID-19 viral infection in non-hospitalized patients.   Subjective: Patient is a 70 y.o. female who has been diagnosed with COVID 19 viral infection.  Their symptoms began on 07/21/2020 with stuffy nose and congestion.  Past Medical History:  Diagnosis Date  . Anemia   . Anxiety   . Breast cancer ([place])    left breast  . Depression   . Diabetes mellitus without complication (HCC)    diet controlled  . Endometrial polyp 01/22/2017  . Fibroid uterus 01/22/2017  . GERD (gastroesophageal reflux disease)   . Hypertension   . Muscle spasm   . Postmenopause bleeding 01/22/2017  . Respiratory disorder    occasional bronchitis, wheezing  . Rheumatoid arthritis ([place])   . Sarcoid     Allergies  Allergen Reactions  . Remicade [Infliximab] Swelling  . Zithromax [Azithromycin] Hives    Previously had issue but last dose did not have any issues     Current Outpatient Medications:  .  ADVAIR DISKUS 500-50 MCG/DOSE AEPB, Inhale 1 puff into the lungs 2 (two) times daily. , Disp: , Rfl: 0 .  albuterol (PROVENTIL HFA;VENTOLIN HFA) 108 (90 Base) MCG/ACT inhaler, Inhale 1-2 puffs into the lungs every 4 (four) hours as needed for wheezing or shortness of breath., Disp: , Rfl:  .  alendronate  (FOSAMAX) 70 MG tablet, Take 70 mg by mouth once a week. On Wednesdays, Disp: , Rfl: 1 .  anastrozole (ARIMIDEX) 1 MG tablet, TAKE 1 TABLET BY MOUTH EVERY DAY, Disp: 30 tablet, Rfl: 3 .  atorvastatin (LIPITOR) 40 MG tablet, Take 40 mg by mouth every morning. , Disp: , Rfl: 1 .  Calcium Carb-Cholecalciferol (CALCIUM 600 + D PO), Take 1 tablet by mouth daily., Disp: , Rfl:  .  cholecalciferol (VITAMIN D) 1000 units tablet, Take 1,000 Units by mouth daily., Disp: , Rfl:  .  cyclobenzaprine (FLEXERIL) 5 MG tablet, Take 1 tablet (5 mg total) by mouth 3 (three) times daily as needed., Disp: 20 tablet, Rfl: 0 .  FIBER SELECT GUMMIES PO, Take 1 each by mouth daily., Disp: , Rfl:  .  folic acid (FOLVITE) 1 MG tablet, Take 1 mg by mouth daily., Disp: , Rfl:  .  ibuprofen (MOTRIN IB) 200 MG tablet, Take 3 tablets (600 mg total) by mouth every 8 (eight) hours as needed., Disp: 30 tablet, Rfl: 0 .  irbesartan (AVAPRO) 300 MG tablet, Take 300 mg by mouth every morning. , Disp: , Rfl: 1 .  methotrexate (RHEUMATREX) 2.5 MG tablet, Take 20 mg by mouth once a week. Takes on Thursdays Caution:Chemotherapy. Protect from light., Disp: , Rfl:  .  Omega-3 1000 MG CAPS, Take 1 capsule by mouth daily., Disp: , Rfl:  .  Turmeric 500 MG CAPS, Take 1 capsule by mouth daily., Disp: , Rfl:   Objective: Patient appears/sounds well.  They are in no apparent distress.  Breathing is non labored.  Mood and behavior are normal.  Laboratory Data:   05/14/2020 (from scanned document) creatinine 0.8, AAeGFR 90.75  Assessment: 70 y.o. female with mild/moderate COVID 19 viral infection diagnosed on 07/24/2020 at high risk for progression to severe COVID 19.  Plan:  This patient is a 70 y.o. female that meets the following criteria for Emergency Use Authorization of: Paxlovid 1. Age >12 yr AND > 40 kg 2. SARS-COV-2 positive test 3. Symptom onset < 5 days 4. Mild-to-moderate COVID disease with high risk for severe progression to  hospitalization or death  Risk factors include ethnicity, age, BMI, HTN.  I have spoken and communicated the following to the patient or parent/caregiver regarding: 1. Paxlovid is an unapproved drug that is authorized for use under an Emergency Use Authorization.  2. There are no adequate, approved, available products for the treatment of COVID-19 in adults who have mild-to-moderate COVID-19 and are at high risk for progressing to severe COVID-19, including hospitalization or death. 3. Other therapeutics are currently authorized. For additional information on all products authorized for treatment or prevention of COVID-19, please see [employer].  4. There are benefits and risks of taking this treatment as outlined in the "Fact Sheet for Patients and Caregivers."  5. "Fact Sheet for Patients and Caregivers" was reviewed with patient. A hard copy will be provided to patient from pharmacy prior to the patient receiving treatment. 6. Patients should continue to self-isolate and use infection control measures (e.g., wear mask, isolate, social distance, avoid sharing personal items, clean and disinfect "high touch" surfaces, and frequent handwashing) according to CDC guidelines.  7. The patient or parent/caregiver has the option to accept or refuse treatment. 8. Patient medication history was reviewed for potential drug interactions:Interaction with home meds: Will hold Advair while taking Paxlovid 9. Patient's creatinine clearance was calculated to be 75, and they were therefore prescribed Normal dose (CrCl>60) - nirmatrelvir 150mg  tab (2 tablet) by mouth twice daily AND ritonavir 100mg  tab (1 tablet) by mouth twice daily   After reviewing above information with the patient, the patient agrees to receive Paxlovid.  Follow up instructions:    . Take prescription BID x 5 days as  directed . Reach out to pharmacist for counseling on medication if desired . For concerns regarding further COVID symptoms please follow up with your PCP or urgent care . For urgent or life-threatening issues, seek care at your local emergency department  The patient was provided an opportunity to ask questions, and all were answered. The patient agreed with the plan and demonstrated an understanding of the instructions.   Script sent to [facility] and opted to pick up RX.  The patient was advised to call their PCP or seek an in-person evaluation if the symptoms worsen or if the condition fails to improve as anticipated.   I provided 15 minutes of non face-to-face telephone visit time during this encounter, and > 50% was spent counseling as documented under my assessment & plan.  Loel Dubonnet, NP 07/24/2020 /1:43 PM

## 2020-07-24 NOTE — Addendum Note (Signed)
Addended by: Loel Dubonnet on: 07/24/2020 02:09 PM   Modules accepted: Orders

## 2020-07-24 NOTE — Telephone Encounter (Signed)
Patient was prescribed oral covid treatment PAXLOVID and treatment note was reviewed. Medication has been received by [place] and reviewed for appropriateness.  Drug Interactions or Dosage Adjustments Noted: Patient was told to hold Advair and Atorvastatin during therapy.  Explained the need for a 12 hour washout for Atorvastatin.  CrCl was calculated at 82ml/min, no need to dose adjust  Delivery Method: patient will pick up  Patient contacted for counseling on 07/24/20 and verbalized understanding.   Delivery or Pick-Up Date: 07/24/2020  Deidre Ala 07/24/2020, 2:31 PM [facility] Outpatient Pharmacist Phone# [phone]

## 2020-11-06 DIAGNOSIS — E785 Hyperlipidemia, unspecified: Secondary | ICD-10-CM | POA: Diagnosis not present

## 2020-11-06 DIAGNOSIS — J302 Other seasonal allergic rhinitis: Secondary | ICD-10-CM | POA: Diagnosis not present

## 2020-11-06 DIAGNOSIS — D509 Iron deficiency anemia, unspecified: Secondary | ICD-10-CM | POA: Diagnosis not present

## 2020-11-06 DIAGNOSIS — J45909 Unspecified asthma, uncomplicated: Secondary | ICD-10-CM | POA: Diagnosis not present

## 2020-11-06 DIAGNOSIS — M069 Rheumatoid arthritis, unspecified: Secondary | ICD-10-CM | POA: Diagnosis not present

## 2020-11-06 DIAGNOSIS — I10 Essential (primary) hypertension: Secondary | ICD-10-CM | POA: Diagnosis not present

## 2020-11-06 DIAGNOSIS — K219 Gastro-esophageal reflux disease without esophagitis: Secondary | ICD-10-CM | POA: Diagnosis not present

## 2020-11-06 DIAGNOSIS — D869 Sarcoidosis, unspecified: Secondary | ICD-10-CM | POA: Diagnosis not present

## 2020-11-06 DIAGNOSIS — E1169 Type 2 diabetes mellitus with other specified complication: Secondary | ICD-10-CM | POA: Diagnosis not present

## 2020-11-11 DIAGNOSIS — E1169 Type 2 diabetes mellitus with other specified complication: Secondary | ICD-10-CM | POA: Diagnosis not present

## 2020-11-11 DIAGNOSIS — M810 Age-related osteoporosis without current pathological fracture: Secondary | ICD-10-CM | POA: Diagnosis not present

## 2020-11-11 DIAGNOSIS — Z79899 Other long term (current) drug therapy: Secondary | ICD-10-CM | POA: Diagnosis not present

## 2020-11-11 DIAGNOSIS — E785 Hyperlipidemia, unspecified: Secondary | ICD-10-CM | POA: Diagnosis not present

## 2020-11-11 DIAGNOSIS — M0579 Rheumatoid arthritis with rheumatoid factor of multiple sites without organ or systems involvement: Secondary | ICD-10-CM | POA: Diagnosis not present

## 2020-12-04 ENCOUNTER — Other Ambulatory Visit: Payer: Self-pay | Admitting: Oncology

## 2020-12-11 ENCOUNTER — Other Ambulatory Visit: Payer: Self-pay

## 2020-12-11 ENCOUNTER — Ambulatory Visit (HOSPITAL_COMMUNITY)
Admission: RE | Admit: 2020-12-11 | Discharge: 2020-12-11 | Disposition: A | Discharge status: Home / Self Care | Payer: Medicare PPO | Source: Ambulatory Visit | Attending: Adult Health | Admitting: Adult Health

## 2020-12-11 ENCOUNTER — Other Ambulatory Visit: Payer: Self-pay | Admitting: Adult Health

## 2020-12-11 ENCOUNTER — Other Ambulatory Visit (HOSPITAL_COMMUNITY): Payer: Self-pay | Admitting: Adult Health

## 2020-12-11 DIAGNOSIS — Z0000 Encounter for general adult medical examination without abnormal findings: Secondary | ICD-10-CM | POA: Diagnosis not present

## 2020-12-11 DIAGNOSIS — R109 Unspecified abdominal pain: Secondary | ICD-10-CM | POA: Diagnosis not present

## 2020-12-11 DIAGNOSIS — S22000A Wedge compression fracture of unspecified thoracic vertebra, initial encounter for closed fracture: Secondary | ICD-10-CM | POA: Diagnosis not present

## 2020-12-11 DIAGNOSIS — J849 Interstitial pulmonary disease, unspecified: Secondary | ICD-10-CM | POA: Diagnosis not present

## 2020-12-11 DIAGNOSIS — M546 Pain in thoracic spine: Secondary | ICD-10-CM

## 2020-12-11 DIAGNOSIS — M818 Other osteoporosis without current pathological fracture: Secondary | ICD-10-CM | POA: Diagnosis not present

## 2020-12-11 DIAGNOSIS — C50412 Malignant neoplasm of upper-outer quadrant of left female breast: Secondary | ICD-10-CM | POA: Diagnosis not present

## 2020-12-11 DIAGNOSIS — E1169 Type 2 diabetes mellitus with other specified complication: Secondary | ICD-10-CM | POA: Diagnosis not present

## 2020-12-11 DIAGNOSIS — M47814 Spondylosis without myelopathy or radiculopathy, thoracic region: Secondary | ICD-10-CM | POA: Diagnosis not present

## 2020-12-11 DIAGNOSIS — E785 Hyperlipidemia, unspecified: Secondary | ICD-10-CM | POA: Diagnosis not present

## 2020-12-25 DIAGNOSIS — E785 Hyperlipidemia, unspecified: Secondary | ICD-10-CM | POA: Diagnosis not present

## 2020-12-25 DIAGNOSIS — R82998 Other abnormal findings in urine: Secondary | ICD-10-CM | POA: Diagnosis not present

## 2020-12-25 DIAGNOSIS — Z0000 Encounter for general adult medical examination without abnormal findings: Secondary | ICD-10-CM | POA: Diagnosis not present

## 2020-12-25 DIAGNOSIS — Z1212 Encounter for screening for malignant neoplasm of rectum: Secondary | ICD-10-CM | POA: Diagnosis not present

## 2020-12-25 DIAGNOSIS — M069 Rheumatoid arthritis, unspecified: Secondary | ICD-10-CM | POA: Diagnosis not present

## 2020-12-25 DIAGNOSIS — E1169 Type 2 diabetes mellitus with other specified complication: Secondary | ICD-10-CM | POA: Diagnosis not present

## 2020-12-25 DIAGNOSIS — I10 Essential (primary) hypertension: Secondary | ICD-10-CM | POA: Diagnosis not present

## 2020-12-25 DIAGNOSIS — C50412 Malignant neoplasm of upper-outer quadrant of left female breast: Secondary | ICD-10-CM | POA: Diagnosis not present

## 2020-12-25 DIAGNOSIS — J45909 Unspecified asthma, uncomplicated: Secondary | ICD-10-CM | POA: Diagnosis not present

## 2020-12-25 DIAGNOSIS — M546 Pain in thoracic spine: Secondary | ICD-10-CM | POA: Diagnosis not present

## 2020-12-25 DIAGNOSIS — D509 Iron deficiency anemia, unspecified: Secondary | ICD-10-CM | POA: Diagnosis not present

## 2021-02-11 DIAGNOSIS — M0579 Rheumatoid arthritis with rheumatoid factor of multiple sites without organ or systems involvement: Secondary | ICD-10-CM | POA: Diagnosis not present

## 2021-02-11 DIAGNOSIS — M810 Age-related osteoporosis without current pathological fracture: Secondary | ICD-10-CM | POA: Diagnosis not present

## 2021-02-11 DIAGNOSIS — E1169 Type 2 diabetes mellitus with other specified complication: Secondary | ICD-10-CM | POA: Diagnosis not present

## 2021-02-11 DIAGNOSIS — E785 Hyperlipidemia, unspecified: Secondary | ICD-10-CM | POA: Diagnosis not present

## 2021-02-11 DIAGNOSIS — Z79899 Other long term (current) drug therapy: Secondary | ICD-10-CM | POA: Diagnosis not present

## 2021-03-06 ENCOUNTER — Other Ambulatory Visit: Payer: Self-pay | Admitting: Oncology

## 2021-03-31 DIAGNOSIS — C50412 Malignant neoplasm of upper-outer quadrant of left female breast: Secondary | ICD-10-CM | POA: Diagnosis not present

## 2021-03-31 DIAGNOSIS — M5416 Radiculopathy, lumbar region: Secondary | ICD-10-CM | POA: Diagnosis not present

## 2021-05-05 DIAGNOSIS — E1169 Type 2 diabetes mellitus with other specified complication: Secondary | ICD-10-CM | POA: Diagnosis not present

## 2021-05-05 DIAGNOSIS — M069 Rheumatoid arthritis, unspecified: Secondary | ICD-10-CM | POA: Diagnosis not present

## 2021-05-05 DIAGNOSIS — E785 Hyperlipidemia, unspecified: Secondary | ICD-10-CM | POA: Diagnosis not present

## 2021-05-05 DIAGNOSIS — R059 Cough, unspecified: Secondary | ICD-10-CM | POA: Diagnosis not present

## 2021-05-05 DIAGNOSIS — D509 Iron deficiency anemia, unspecified: Secondary | ICD-10-CM | POA: Diagnosis not present

## 2021-05-05 DIAGNOSIS — M818 Other osteoporosis without current pathological fracture: Secondary | ICD-10-CM | POA: Diagnosis not present

## 2021-05-05 DIAGNOSIS — I10 Essential (primary) hypertension: Secondary | ICD-10-CM | POA: Diagnosis not present

## 2021-05-05 DIAGNOSIS — M5416 Radiculopathy, lumbar region: Secondary | ICD-10-CM | POA: Diagnosis not present

## 2021-05-05 DIAGNOSIS — J45909 Unspecified asthma, uncomplicated: Secondary | ICD-10-CM | POA: Diagnosis not present

## 2021-05-15 ENCOUNTER — Encounter: Payer: Self-pay | Admitting: Oncology

## 2021-05-15 ENCOUNTER — Inpatient Hospital Stay: Payer: Medicare PPO | Attending: Oncology | Admitting: Oncology

## 2021-05-15 DIAGNOSIS — C50412 Malignant neoplasm of upper-outer quadrant of left female breast: Secondary | ICD-10-CM

## 2021-05-15 NOTE — Progress Notes (Signed)
[place]  Telephone:(336) [phone] Fax:(336) [phone]     ID: Dolorez Jeffrey DOB: 05-31-1951  MR#: [id number]  TIR#:[id number]  Patient Care Team: Prince Solian, MD as PCP - General (Internal Medicine) Excell Seltzer, MD (Inactive) as Consulting Physician (General Surgery) Destyne Goodreau, Virgie Dad, MD as Consulting Physician (Oncology) Gery Pray, MD as Consulting Physician (Radiation Oncology) Azucena Fallen, MD as Consulting Physician (Obstetrics and Gynecology) Valinda Party, MD (Rheumatology) Brion Aliment, RN as Registered Nurse Causey, Charlestine Massed, NP as Nurse Practitioner (Hematology and Oncology) OTHER MD:  CHIEF COMPLAINT: Ductal carcinoma in situ  CURRENT TREATMENT: Anastrozole   INTERVAL HISTORY: Therisa was scheduled today for a follow-up of her noninvasive breast cancer.  However she did not show.    Since her last visit, she underwent repeat bone density screening on 07/09/2020 showed a T-score of -0.7, which is considered normal. This is improved from prior of -1.1 in 2019 with fosamax.  Her most recent mammogram was on 03/14/2020.   REVIEW OF SYSTEMS [place]    COVID 19 VACCINATION STATUS: Pfizer x2; infection 07/2020, treated with paxlovid   BREAST CANCER HISTORY: From the original intake note:  The patient had routine screening mammography November 2017 showing an area of microcalcifications in the left breast. She was referred for diagnostic mammography at the [facility] 06/17/2016 and this showed the breast density to be category B. In the upper-outer quadrant of the left breast there was an area of heterogeneous microcalcifications measuring 0.4 cm.  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  Her subsequent history  is as detailed below.   PAST MEDICAL HISTORY: Past Medical History:  Diagnosis Date   Anemia    Anxiety    Breast cancer ([place])    left breast   Depression    Diabetes mellitus without complication ([place])    diet controlled   Endometrial polyp 01/22/2017   Fibroid uterus 01/22/2017   GERD (gastroesophageal reflux disease)    Hypertension    Muscle spasm    Postmenopause bleeding 01/22/2017   Respiratory disorder    occasional bronchitis, wheezing   Rheumatoid arthritis ([place])    Sarcoid     PAST SURGICAL HISTORY: Past Surgical History:  Procedure Laterality Date   BREAST BIOPSY Left 01/2018   benign   BREAST LUMPECTOMY Left 08/2016   BREAST LUMPECTOMY WITH RADIOACTIVE SEED LOCALIZATION Left 09/04/2016   Procedure: BREAST LUMPECTOMY WITH RADIOACTIVE SEED LOCALIZATION;  Surgeon: Excell Seltzer, MD;  Location: [facility];  Service: General;  Laterality: Left;   [place] N/A 01/22/2017   Procedure: DILATATION & CURETTAGE/HYSTEROSCOPY WITH MYOSURE;  Surgeon: Azucena Fallen, MD;  Location: [facility];  Service: Gynecology;  Laterality: N/A;   FOOT SURGERY     LYMPH NODE BIOPSY     before 1976 by her neck to help diagnos sarcoidosis   MOUTH SURGERY      FAMILY HISTORY Family History  Problem Relation Age of Onset   Diabetes Mother    Hypertension Mother    Diabetes Father    Hypertension Father   Patient's father died in his 66s from heart disease. The patient's mother experienced sudden death at age 32. The patient had 2 brothers, 1 sister. There is a family  history of melanoma.   GYNECOLOGIC HISTORY:  No LMP recorded. Patient is postmenopausal. Menarche age 46, first live birth age 1. The patient is GX P1. She went through menopause in her early 21s. She did not take hormone replacement. She used oral contraceptives for some time in the remote past without complications.   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 At home it's just her "and Jesus", with no pets. Her daughter [name] lives in [place] him a and works in [employer] setting. The patient has no grandchildren. She attends a Physicist, medical church.     ADVANCED DIRECTIVES: Not in place   HEALTH MAINTENANCE: Social History   Tobacco Use   Smoking status: Former    Types: Cigarettes    Quit date: 07/01/1974    Years since quitting: 46.9   Smokeless tobacco: Never  Substance Use Topics   Alcohol use: Yes    Comment: occ   Drug use: No     Colonoscopy:2016/[facility]  PAP: 2017  Bone density: 05/14/2009 at [facility], T score 0.4 (normal)   Allergies  Allergen Reactions   Remicade [Infliximab] Swelling   Zithromax [Azithromycin] Hives    Previously had issue but last dose did not have any issues    Current Outpatient Medications  Medication Sig Dispense Refill   ADVAIR DISKUS 500-50 MCG/DOSE AEPB Inhale 1 puff into the lungs 2 (two) times daily.   0   albuterol (PROVENTIL HFA;VENTOLIN HFA) 108 (90 Base) MCG/ACT inhaler Inhale 1-2 puffs into the lungs every 4 (four) hours as needed for wheezing or shortness of breath.     alendronate (FOSAMAX) 70 MG tablet Take 70 mg by mouth once a week. On Wednesdays  1   anastrozole (ARIMIDEX) 1 MG tablet TAKE 1 TABLET BY MOUTH EVERY DAY 30 tablet 3   anastrozole (ARIMIDEX) 1 MG tablet TAKE 1 TABLET BY MOUTH EVERY DAY 30 tablet 3   atorvastatin (LIPITOR) 40 MG tablet Take 40 mg by mouth every morning.   1   Calcium Carb-Cholecalciferol (CALCIUM 600 + D PO) Take 1 tablet by mouth daily.     cholecalciferol (VITAMIN D) 1000 units tablet Take 1,000 Units by mouth daily.     cyclobenzaprine (FLEXERIL) 5 MG tablet Take 1 tablet (5 mg total) by mouth 3 (three) times daily as needed. 20 tablet 0   FIBER SELECT GUMMIES PO Take 1 each by mouth daily.     folic acid (FOLVITE) 1 MG  tablet Take 1 mg by mouth daily.     ibuprofen (MOTRIN IB) 200 MG tablet Take 3 tablets (600 mg total) by mouth every 8 (eight) hours as needed. 30 tablet 0   irbesartan (AVAPRO) 300 MG tablet Take 300 mg by mouth every morning.   1   levofloxacin (LEVAQUIN) 500 MG tablet Take 500 mg by mouth daily. Take 1 tablet daily for 10 days     methotrexate (RHEUMATREX) 2.5 MG tablet Take 20 mg by mouth once a week. Takes on Thursdays Caution:Chemotherapy. Protect from light.     Nirmatrelvir & Ritonavir 20 x 150 MG & 10 x 100MG  TBPK TAKE 3 TABLETS BY MOUTH 2 TIMES DAILY FOR 5 DAYS. 30 each 0   Omega-3 1000 MG CAPS Take 1 capsule by mouth daily.     Turmeric 500 MG CAPS Take 1 capsule by mouth daily.     No current facility-administered medications for this visit.  OBJECTIVE:   There were no vitals filed for this visit.    There is no height or weight on file to calculate BMI.    ECOG FS:    LAB RESULTS:  CMP     Component Value Date/Time   NA 139 01/18/2017 1525   NA 143 07/01/2016 0847   K 4.1 01/18/2017 1525   K 3.7 07/01/2016 0847   CL 105 01/18/2017 1525   CO2 27 01/18/2017 1525   CO2 29 07/01/2016 0847   GLUCOSE 128 (H) 01/18/2017 1525   GLUCOSE 124 07/01/2016 0847   BUN 14 01/18/2017 1525   BUN 7.5 07/01/2016 0847   CREATININE 0.90 01/18/2017 1525   CREATININE 0.7 07/01/2016 0847   CALCIUM 9.7 01/18/2017 1525   CALCIUM 9.1 07/01/2016 0847   PROT 7.1 07/01/2016 0847   ALBUMIN 3.4 (L) 07/01/2016 0847   AST 17 07/01/2016 0847   ALT 17 07/01/2016 0847   ALKPHOS 77 07/01/2016 0847   BILITOT 0.54 07/01/2016 0847   GFRNONAA >60 01/18/2017 1525   GFRAA >60 01/18/2017 1525    INo results found for: SPEP, UPEP  Lab Results  Component Value Date   WBC 5.4 01/18/2017   NEUTROABS 2.5 07/01/2016   HGB 12.8 01/18/2017   HCT 40.8 01/18/2017   MCV 78.0 01/18/2017   PLT 196 01/18/2017      Chemistry      Component Value Date/Time   NA 139 01/18/2017 1525   NA 143  07/01/2016 0847   K 4.1 01/18/2017 1525   K 3.7 07/01/2016 0847   CL 105 01/18/2017 1525   CO2 27 01/18/2017 1525   CO2 29 07/01/2016 0847   BUN 14 01/18/2017 1525   BUN 7.5 07/01/2016 0847   CREATININE 0.90 01/18/2017 1525   CREATININE 0.7 07/01/2016 0847      Component Value Date/Time   CALCIUM 9.7 01/18/2017 1525   CALCIUM 9.1 07/01/2016 0847   ALKPHOS 77 07/01/2016 0847   AST 17 07/01/2016 0847   ALT 17 07/01/2016 0847   BILITOT 0.54 07/01/2016 0847       No results found for: LABCA2  No components found for: LABCA125  No results for input(s): INR in the last 168 hours.  Urinalysis No results found for: COLORURINE, APPEARANCEUR, LABSPEC, PHURINE, GLUCOSEU, HGBUR, BILIRUBINUR, KETONESUR, PROTEINUR, UROBILINOGEN, NITRITE, LEUKOCYTESUR   STUDIES: No results found.   ELIGIBLE FOR AVAILABLE RESEARCH PROTOCOL:  Decided against COMET  trial  ASSESSMENT: 70 y.o. [place] woman status post left breast upper outer quadrant biopsy 06/17/2016 for ductal carcinoma in situ, grade 2, estrogen and progesterone receptor positive  (1)  status post left lumpectomy 09/04/2016 for low-grade ductal carcinoma in situ, with negative margins.   (a) additional left breast biopsies 01/19/2018 showed only fibrocystic changes, no evidence of malignancy.  (2) started anastrozole 08/05/2016, discontinued after 3 days with unusual symptoms, which immediately resolved   (3) adjuvant radiation felt to only marginally improve her risk of local recurrence assuming she took anti-estrogens, with no effect on survival: patient opted against it  (4) resumed anastrozole March 2018  (a) bone density 07/04/2018 shows a T score of -1.1.   PLAN: Arlen did not show for her 05/15/2021 visit.  A follow-up letter has been sent. Total encounter time: 20 minutes*   Arie Gable C. Samari Gorby, MD 05/15/21 3:38 AM Medical Oncology and Hematology [facility] [place], [place]  [address] Tel. [phone]    Fax. [phone]   I, Wilburn Mylar,  am acting as scribe for Dr. Sarajane Jews C. Estell Dillinger.  I, Lurline Del MD, have reviewed the above documentation for accuracy and completeness, and I agree with the above.    *Total Encounter Time as defined by the Centers for Medicare and Medicaid Services includes, in addition to the face-to-face time of a patient visit (documented in the note above) non-face-to-face time: obtaining and reviewing outside history, ordering and reviewing medications, tests or procedures, care coordination (communications with other health care professionals or caregivers) and documentation in the medical record.

## 2021-06-04 ENCOUNTER — Other Ambulatory Visit: Payer: Self-pay | Admitting: Oncology

## 2021-06-04 NOTE — Telephone Encounter (Signed)
Refill sent with note for pt to call office to schedule folllow up. LOS sent to scheduling per above.

## 2021-06-17 ENCOUNTER — Inpatient Hospital Stay: Payer: Medicare PPO | Admitting: Oncology

## 2021-06-17 NOTE — Progress Notes (Signed)
[place]  Telephone:(336) [phone] Fax:(336) [phone]     ID: Kristin Arroyo DOB: 1951/03/27  MR#: [id number]  CSN#:[id number]  Patient Care Team: Prince Solian, MD as PCP - General (Internal Medicine) Excell Seltzer, MD (Inactive) as Consulting Physician (General Surgery) Jerimiah Wolman, Virgie Dad, MD as Consulting Physician (Oncology) Gery Pray, MD as Consulting Physician (Radiation Oncology) Azucena Fallen, MD as Consulting Physician (Obstetrics and Gynecology) Valinda Party, MD (Rheumatology) Brion Aliment, RN as Registered Nurse Causey, Charlestine Massed, NP as Nurse Practitioner (Hematology and Oncology) OTHER MD:  CHIEF COMPLAINT: Ductal carcinoma in situ  CURRENT TREATMENT: Completing 5 years of anastrozole   INTERVAL HISTORY: Kristin Arroyo returns today for a follow-up of her noninvasive breast cancer.  She did not show for her prior visit appointment 05/15/2021  She continues on anastrozole, which she began 09/2016.  She is tolerating this well, with no unusual side effects.  Since her last visit, she underwent repeat bone density screening on 07/09/2020 showed a T-score of -0.7, which is considered normal. This is improved from prior of -1.1 in 2019 with fosamax.  Her most recent mammogram was on 03/14/2020 at the [facility].  MRI of the thoracic spine 12/11/2020 for evaluation of possible compression fracture showed a normal thoracic spine.  There was no evidence of fracture.   REVIEW OF SYSTEMS Kristin Arroyo retired 2021 and says it is wonderful.  She is however not exercising as much as I would like.  She did a little bit of walking in the summer.  She discovered that she has Silver sneakers and [facility] other [facility].  Aside from that a detailed review of systems today was stable   COVID 19 VACCINATION STATUS: [place] x2; infection 07/2020, treated with paxlovid   BREAST CANCER HISTORY: From the original intake  note:  The patient had routine screening mammography November 2017 showing an area of microcalcifications in the left breast. She was referred for diagnostic mammography at the [facility] 06/17/2016 and this showed the breast density to be category B. In the upper-outer quadrant of the left breast there was an area of heterogeneous microcalcifications measuring 0.4 cm.  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  Her subsequent history is as detailed below.   PAST MEDICAL HISTORY: Past Medical History:  Diagnosis Date   Anemia    Anxiety    Breast cancer ([place])    left breast   Depression    Diabetes mellitus without complication ([facility])    diet controlled   Endometrial polyp 01/22/2017   Fibroid uterus 01/22/2017   GERD (gastroesophageal reflux disease)    Hypertension    Muscle spasm    Postmenopause bleeding 01/22/2017   Respiratory disorder    occasional bronchitis, wheezing   Rheumatoid arthritis ([place])    Sarcoid     PAST SURGICAL HISTORY: Past Surgical History:  Procedure Laterality Date   BREAST BIOPSY Left 01/2018   benign   BREAST LUMPECTOMY Left 08/2016   BREAST LUMPECTOMY WITH RADIOACTIVE SEED LOCALIZATION Left 09/04/2016   Procedure: BREAST LUMPECTOMY WITH RADIOACTIVE SEED LOCALIZATION;  Surgeon: Excell Seltzer, MD;  Location: [facility];  Service: General;  Laterality: Left;   CESAREAN SECTION     COLONOSCOPY     DILATATION & CURETTAGE/HYSTEROSCOPY  WITH MYOSURE N/A 01/22/2017   Procedure: DILATATION & CURETTAGE/HYSTEROSCOPY WITH MYOSURE;  Surgeon: Azucena Fallen, MD;  Location: [facility];  Service: Gynecology;  Laterality: N/A;   FOOT SURGERY     LYMPH NODE BIOPSY     before 1976 by her neck to help diagnos sarcoidosis   MOUTH SURGERY       FAMILY HISTORY Family History  Problem Relation Age of Onset   Diabetes Mother    Hypertension Mother    Diabetes Father    Hypertension Father   Patient's father died in his 25s from heart disease. The patient's mother experienced sudden death at age 49. The patient had 2 brothers, 1 sister. There is a family history of melanoma.   GYNECOLOGIC HISTORY:  No LMP recorded. Patient is postmenopausal. Menarche age 37, first live birth age 50. The patient is GX P1. She went through menopause in her early 41s. She did not take hormone replacement. She used oral contraceptives for some time in the remote past without complications.   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  She retired in 2021.  At home it's just her "and Jesus", with no pets. Her daughter Kristin Arroyo lives in [place] and works in the food service setting. The patient has no grandchildren. She attends a Physicist, medical church.     ADVANCED DIRECTIVES: Not in place   HEALTH MAINTENANCE: Social History   Tobacco Use   Smoking status: Former    Types: Cigarettes    Quit date: 07/01/1974    Years since quitting: 46.9   Smokeless tobacco: Never  Substance Use Topics   Alcohol use: Yes    Comment: occ   Drug use: No     Colonoscopy:2016/[facility]  PAP: 2017  Bone density: 05/14/2009 at [facility], T score 0.4 (normal)   Allergies  Allergen Reactions   Remicade [Infliximab] Swelling   Zithromax [Azithromycin] Hives    Previously had issue but last dose did not have any issues    Current Outpatient Medications  Medication Sig Dispense Refill   ADVAIR DISKUS 500-50 MCG/DOSE AEPB Inhale 1 puff into the lungs 2 (two) times daily.   0   albuterol (PROVENTIL HFA;VENTOLIN HFA) 108 (90 Base) MCG/ACT inhaler Inhale 1-2 puffs into the lungs every 4 (four) hours as needed for wheezing or shortness of breath.     alendronate (FOSAMAX) 70  MG tablet Take 70 mg by mouth once a week. On Wednesdays  1   anastrozole (ARIMIDEX) 1 MG tablet TAKE 1 TABLET BY MOUTH EVERY DAY 30 tablet 3   anastrozole (ARIMIDEX) 1 MG tablet TAKE 1 TABLET BY MOUTH EVERY DAY. MUST MAKE APPOINTMENT FIR FUTURE REFILLS 90 tablet 4   atorvastatin (LIPITOR) 40 MG tablet Take 40 mg by mouth every morning.   1   Calcium Carb-Cholecalciferol (CALCIUM 600 + D PO) Take 1 tablet by mouth daily.     cholecalciferol (VITAMIN D) 1000 units tablet Take 1,000 Units by mouth daily.     cyclobenzaprine (FLEXERIL) 5 MG tablet Take 1 tablet (5 mg total) by mouth 3 (three) times daily as needed. 20 tablet 0   FIBER SELECT GUMMIES PO Take 1 each by mouth daily.     folic acid (FOLVITE) 1 MG tablet Take 1 mg by mouth daily.     ibuprofen (MOTRIN IB) 200 MG tablet Take 3 tablets (600 mg total) by mouth every 8 (eight) hours as needed. 30 tablet 0  irbesartan (AVAPRO) 300 MG tablet Take 300 mg by mouth every morning.   1   levofloxacin (LEVAQUIN) 500 MG tablet Take 500 mg by mouth daily. Take 1 tablet daily for 10 days     methotrexate (RHEUMATREX) 2.5 MG tablet Take 20 mg by mouth once a week. Takes on Thursdays Caution:Chemotherapy. Protect from light.     Nirmatrelvir & Ritonavir 20 x 150 MG & 10 x 100MG  TBPK TAKE 3 TABLETS BY MOUTH 2 TIMES DAILY FOR 5 DAYS. 30 each 0   Omega-3 1000 MG CAPS Take 1 capsule by mouth daily.     Turmeric 500 MG CAPS Take 1 capsule by mouth daily.     No current facility-administered medications for this visit.    OBJECTIVE: African-American woman in no acute distress  Vitals:   06/18/21 1101  BP: 129/87  Pulse: 83  Resp: 17  Temp: (!) 97.3 F (36.3 C)  SpO2: 96%      Body mass index is 36.29 kg/m.    ECOG FS:  Sclerae unicteric, EOMs intact Wearing a mask No cervical or supraclavicular adenopathy Lungs no rales or rhonchi Heart regular rate and rhythm Abd soft, nontender, positive bowel sounds MSK no focal spinal tenderness, no  upper extremity lymphedema Neuro: nonfocal, well oriented, appropriate affect Breasts: The right breast is benign.  The left breast is status postlumpectomy and radiation.  There is no evidence of local recurrence.  Both axillae are benign   LAB RESULTS:  CMP     Component Value Date/Time   NA 139 01/18/2017 1525   NA 143 07/01/2016 0847   K 4.1 01/18/2017 1525   K 3.7 07/01/2016 0847   CL 105 01/18/2017 1525   CO2 27 01/18/2017 1525   CO2 29 07/01/2016 0847   GLUCOSE 128 (H) 01/18/2017 1525   GLUCOSE 124 07/01/2016 0847   BUN 14 01/18/2017 1525   BUN 7.5 07/01/2016 0847   CREATININE 0.90 01/18/2017 1525   CREATININE 0.7 07/01/2016 0847   CALCIUM 9.7 01/18/2017 1525   CALCIUM 9.1 07/01/2016 0847   PROT 7.1 07/01/2016 0847   ALBUMIN 3.4 (L) 07/01/2016 0847   AST 17 07/01/2016 0847   ALT 17 07/01/2016 0847   ALKPHOS 77 07/01/2016 0847   BILITOT 0.54 07/01/2016 0847   GFRNONAA >60 01/18/2017 1525   GFRAA >60 01/18/2017 1525    INo results found for: SPEP, UPEP  Lab Results  Component Value Date   WBC 5.4 01/18/2017   NEUTROABS 2.5 07/01/2016   HGB 12.8 01/18/2017   HCT 40.8 01/18/2017   MCV 78.0 01/18/2017   PLT 196 01/18/2017      Chemistry      Component Value Date/Time   NA 139 01/18/2017 1525   NA 143 07/01/2016 0847   K 4.1 01/18/2017 1525   K 3.7 07/01/2016 0847   CL 105 01/18/2017 1525   CO2 27 01/18/2017 1525   CO2 29 07/01/2016 0847   BUN 14 01/18/2017 1525   BUN 7.5 07/01/2016 0847   CREATININE 0.90 01/18/2017 1525   CREATININE 0.7 07/01/2016 0847      Component Value Date/Time   CALCIUM 9.7 01/18/2017 1525   CALCIUM 9.1 07/01/2016 0847   ALKPHOS 77 07/01/2016 0847   AST 17 07/01/2016 0847   ALT 17 07/01/2016 0847   BILITOT 0.54 07/01/2016 0847       No results found for: LABCA2  No components found for: LABCA125  No results for input(s): INR in the last 168  hours.  Urinalysis No results found for: COLORURINE, APPEARANCEUR,  LABSPEC, PHURINE, GLUCOSEU, HGBUR, BILIRUBINUR, KETONESUR, PROTEINUR, UROBILINOGEN, NITRITE, LEUKOCYTESUR   STUDIES: No results found.   ELIGIBLE FOR AVAILABLE RESEARCH PROTOCOL:  Decided against COMET  trial  ASSESSMENT: 70 y.o. Kristin Arroyo woman status post left breast upper outer quadrant biopsy 06/17/2016 for ductal carcinoma in situ, grade 2, estrogen and progesterone receptor positive  (1)  status post left lumpectomy 09/04/2016 for low-grade ductal carcinoma in situ, with negative margins.   (a) additional left breast biopsies 01/19/2018 showed only fibrocystic changes, no evidence of malignancy.  (2) started anastrozole 08/05/2016, discontinued after 3 days with unusual symptoms, which immediately resolved   (3) adjuvant radiation felt to only marginally improve her risk of local recurrence assuming she took anti-estrogens, with no effect on survival: patient opted against it  (4) resumed anastrozole March 2018, completing five years February 2023  (a) bone density 07/04/2018 shows a T score of -1.1.   PLAN: Kristin Arroyo is now coming up on 5 years from definitive surgery for her breast cancer with no evidence of disease recurrence.  This is very febrile.  She will complete 5 years of anastrozole in February.  She understands she only needs to buy enough of the medication to get her through that month.  She is behind on mammography and I have gone ahead and put that order in for her.  She is should have a mammogram before the end of this month.  At this point I feel comfortable releasing her to her primary care physicians.  All she will need in terms of breast cancer follow-up is a yearly mammography and a yearly physician breast exam  We will be glad to see Kristin Arroyo again at any point in the future if and when the need arises but as of now are making no further routine appointments for her here.  Total encounter time 20 minutes.*  Total encounter time: 20 minutes*   Laker Thompson C.  Hagan Maltz, MD 06/18/21 11:15 AM Medical Oncology and Hematology [facility] [place],  [address] Tel. [phone]    Fax. [phone]   I, Wilburn Mylar, am acting as scribe for Dr. Virgie Dad. Lando Alcalde.  I, Lurline Del MD, have reviewed the above documentation for accuracy and completeness, and I agree with the above.   *Total Encounter Time as defined by the Centers for Medicare and Medicaid Services includes, in addition to the face-to-face time of a patient visit (documented in the note above) non-face-to-face time: obtaining and reviewing outside history, ordering and reviewing medications, tests or procedures, care coordination (communications with other health care professionals or caregivers) and documentation in the medical record.

## 2021-06-18 ENCOUNTER — Other Ambulatory Visit: Payer: Self-pay

## 2021-06-18 ENCOUNTER — Inpatient Hospital Stay: Payer: Medicare PPO | Attending: Oncology | Admitting: Oncology

## 2021-06-18 VITALS — BP 129/87 | HR 83 | Temp 97.3°F | Resp 17 | Ht 64.0 in | Wt 211.4 lb

## 2021-06-18 DIAGNOSIS — Z170 Estrogen receptor positive status [ER+]: Secondary | ICD-10-CM | POA: Insufficient documentation

## 2021-06-18 DIAGNOSIS — Z79811 Long term (current) use of aromatase inhibitors: Secondary | ICD-10-CM | POA: Diagnosis not present

## 2021-06-18 DIAGNOSIS — D0512 Intraductal carcinoma in situ of left breast: Secondary | ICD-10-CM | POA: Diagnosis not present

## 2021-06-18 DIAGNOSIS — C50412 Malignant neoplasm of upper-outer quadrant of left female breast: Secondary | ICD-10-CM | POA: Diagnosis not present

## 2021-07-03 DIAGNOSIS — H2512 Age-related nuclear cataract, left eye: Secondary | ICD-10-CM | POA: Diagnosis not present

## 2021-07-03 DIAGNOSIS — H5212 Myopia, left eye: Secondary | ICD-10-CM | POA: Diagnosis not present

## 2021-07-03 DIAGNOSIS — E119 Type 2 diabetes mellitus without complications: Secondary | ICD-10-CM | POA: Diagnosis not present

## 2021-07-03 DIAGNOSIS — H25012 Cortical age-related cataract, left eye: Secondary | ICD-10-CM | POA: Diagnosis not present

## 2021-07-15 ENCOUNTER — Other Ambulatory Visit: Payer: Self-pay | Admitting: *Deleted

## 2021-07-22 DIAGNOSIS — R0689 Other abnormalities of breathing: Secondary | ICD-10-CM | POA: Diagnosis not present

## 2021-07-22 DIAGNOSIS — Z0000 Encounter for general adult medical examination without abnormal findings: Secondary | ICD-10-CM | POA: Diagnosis not present

## 2021-07-22 DIAGNOSIS — Z6836 Body mass index (BMI) 36.0-36.9, adult: Secondary | ICD-10-CM | POA: Diagnosis not present

## 2021-08-11 ENCOUNTER — Other Ambulatory Visit: Payer: Self-pay | Admitting: Internal Medicine

## 2021-08-11 DIAGNOSIS — C50412 Malignant neoplasm of upper-outer quadrant of left female breast: Secondary | ICD-10-CM

## 2021-08-12 DIAGNOSIS — E1169 Type 2 diabetes mellitus with other specified complication: Secondary | ICD-10-CM | POA: Diagnosis not present

## 2021-08-12 DIAGNOSIS — C50412 Malignant neoplasm of upper-outer quadrant of left female breast: Secondary | ICD-10-CM | POA: Diagnosis not present

## 2021-08-12 DIAGNOSIS — R051 Acute cough: Secondary | ICD-10-CM | POA: Diagnosis not present

## 2021-08-12 DIAGNOSIS — J45909 Unspecified asthma, uncomplicated: Secondary | ICD-10-CM | POA: Diagnosis not present

## 2021-08-12 DIAGNOSIS — R5383 Other fatigue: Secondary | ICD-10-CM | POA: Diagnosis not present

## 2021-08-12 DIAGNOSIS — R0981 Nasal congestion: Secondary | ICD-10-CM | POA: Diagnosis not present

## 2021-08-12 DIAGNOSIS — U071 COVID-19: Secondary | ICD-10-CM | POA: Diagnosis not present

## 2021-08-12 DIAGNOSIS — D869 Sarcoidosis, unspecified: Secondary | ICD-10-CM | POA: Diagnosis not present

## 2021-08-12 DIAGNOSIS — M069 Rheumatoid arthritis, unspecified: Secondary | ICD-10-CM | POA: Diagnosis not present

## 2021-09-09 ENCOUNTER — Ambulatory Visit
Admission: RE | Admit: 2021-09-09 | Discharge: 2021-09-09 | Disposition: A | Discharge status: Home / Self Care | Payer: Medicare PPO | Source: Ambulatory Visit | Attending: Internal Medicine | Admitting: Internal Medicine

## 2021-09-09 DIAGNOSIS — R922 Inconclusive mammogram: Secondary | ICD-10-CM | POA: Diagnosis not present

## 2021-09-09 DIAGNOSIS — C50412 Malignant neoplasm of upper-outer quadrant of left female breast: Secondary | ICD-10-CM

## 2021-09-10 DIAGNOSIS — E1169 Type 2 diabetes mellitus with other specified complication: Secondary | ICD-10-CM | POA: Diagnosis not present

## 2021-09-10 DIAGNOSIS — M0579 Rheumatoid arthritis with rheumatoid factor of multiple sites without organ or systems involvement: Secondary | ICD-10-CM | POA: Diagnosis not present

## 2021-09-10 DIAGNOSIS — I10 Essential (primary) hypertension: Secondary | ICD-10-CM | POA: Diagnosis not present

## 2021-09-10 DIAGNOSIS — U071 COVID-19: Secondary | ICD-10-CM | POA: Diagnosis not present

## 2021-09-10 DIAGNOSIS — K219 Gastro-esophageal reflux disease without esophagitis: Secondary | ICD-10-CM | POA: Diagnosis not present

## 2021-09-10 DIAGNOSIS — E785 Hyperlipidemia, unspecified: Secondary | ICD-10-CM | POA: Diagnosis not present

## 2021-09-10 DIAGNOSIS — M818 Other osteoporosis without current pathological fracture: Secondary | ICD-10-CM | POA: Diagnosis not present

## 2021-09-10 DIAGNOSIS — J302 Other seasonal allergic rhinitis: Secondary | ICD-10-CM | POA: Diagnosis not present

## 2021-09-10 DIAGNOSIS — M069 Rheumatoid arthritis, unspecified: Secondary | ICD-10-CM | POA: Diagnosis not present

## 2021-09-10 DIAGNOSIS — J45909 Unspecified asthma, uncomplicated: Secondary | ICD-10-CM | POA: Diagnosis not present

## 2021-09-17 DIAGNOSIS — D72819 Decreased white blood cell count, unspecified: Secondary | ICD-10-CM | POA: Diagnosis not present

## 2021-09-17 DIAGNOSIS — Z79899 Other long term (current) drug therapy: Secondary | ICD-10-CM | POA: Diagnosis not present

## 2021-09-17 DIAGNOSIS — M810 Age-related osteoporosis without current pathological fracture: Secondary | ICD-10-CM | POA: Diagnosis not present

## 2021-09-17 DIAGNOSIS — M0579 Rheumatoid arthritis with rheumatoid factor of multiple sites without organ or systems involvement: Secondary | ICD-10-CM | POA: Diagnosis not present

## 2021-09-17 DIAGNOSIS — E1169 Type 2 diabetes mellitus with other specified complication: Secondary | ICD-10-CM | POA: Diagnosis not present

## 2021-09-17 DIAGNOSIS — E785 Hyperlipidemia, unspecified: Secondary | ICD-10-CM | POA: Diagnosis not present

## 2021-09-18 DIAGNOSIS — E1169 Type 2 diabetes mellitus with other specified complication: Secondary | ICD-10-CM | POA: Diagnosis not present

## 2021-09-26 DIAGNOSIS — Z124 Encounter for screening for malignant neoplasm of cervix: Secondary | ICD-10-CM | POA: Diagnosis not present

## 2021-09-26 DIAGNOSIS — R87610 Atypical squamous cells of undetermined significance on cytologic smear of cervix (ASC-US): Secondary | ICD-10-CM | POA: Diagnosis not present

## 2021-09-26 DIAGNOSIS — Z6836 Body mass index (BMI) 36.0-36.9, adult: Secondary | ICD-10-CM | POA: Diagnosis not present

## 2021-09-26 DIAGNOSIS — Z01419 Encounter for gynecological examination (general) (routine) without abnormal findings: Secondary | ICD-10-CM | POA: Diagnosis not present

## 2021-09-26 DIAGNOSIS — Z01411 Encounter for gynecological examination (general) (routine) with abnormal findings: Secondary | ICD-10-CM | POA: Diagnosis not present

## 2021-10-28 DIAGNOSIS — R87610 Atypical squamous cells of undetermined significance on cytologic smear of cervix (ASC-US): Secondary | ICD-10-CM | POA: Diagnosis not present

## 2021-10-28 DIAGNOSIS — R87810 Cervical high risk human papillomavirus (HPV) DNA test positive: Secondary | ICD-10-CM | POA: Diagnosis not present

## 2021-12-01 ENCOUNTER — Other Ambulatory Visit: Payer: Self-pay

## 2021-12-01 ENCOUNTER — Encounter (HOSPITAL_COMMUNITY): Payer: Self-pay

## 2021-12-01 ENCOUNTER — Emergency Department (HOSPITAL_COMMUNITY)
Admission: EM | Admit: 2021-12-01 | Discharge: 2021-12-01 | Disposition: A | Discharge status: Home / Self Care | Payer: Medicare PPO | Attending: Emergency Medicine | Admitting: Emergency Medicine

## 2021-12-01 DIAGNOSIS — I10 Essential (primary) hypertension: Secondary | ICD-10-CM | POA: Diagnosis not present

## 2021-12-01 DIAGNOSIS — W57XXXA Bitten or stung by nonvenomous insect and other nonvenomous arthropods, initial encounter: Secondary | ICD-10-CM | POA: Insufficient documentation

## 2021-12-01 DIAGNOSIS — S70361A Insect bite (nonvenomous), right thigh, initial encounter: Secondary | ICD-10-CM | POA: Diagnosis not present

## 2021-12-01 DIAGNOSIS — S79921A Unspecified injury of right thigh, initial encounter: Secondary | ICD-10-CM | POA: Diagnosis present

## 2021-12-01 DIAGNOSIS — E119 Type 2 diabetes mellitus without complications: Secondary | ICD-10-CM | POA: Diagnosis not present

## 2021-12-01 NOTE — Discharge Instructions (Signed)
Please keep the area clean, you may apply over the counter antiseptics, may use hydrocortisone cream for itchiness, and/or Benadryl/Claritin.  Please call your PCP as needed  Please come back in for reevaluation if you notice worsening swelling, redness, worsening pain as he may need further evaluation.

## 2021-12-01 NOTE — ED Provider Notes (Signed)
[facility] DEPT Provider Note   CSN: [id number] Arrival date & time: 12/01/21  1938     History  Chief Complaint  Patient presents with   Insect Bite    Kristin Arroyo is a 71 y.o. female.  HPI  With medical history including hypertension, anxiety, rheumatoid arthritis, diabetes presents with complains of being bit by a bug.  states that she was bit by a bug around 6 or 7 PM today, she is unclear what bug bit her, but she felt a sharp pain on the anterior aspect of her right thigh,  states that she removed her pants and did not locate any bugs.  She noticed a red spot.  The area is now just sore, she denies any drainage or discharge no tongue throat lip swelling difficulty breathing no systemic rash.  No nausea or vomiting.  She currently has no other complaints.     Home Medications Prior to Admission medications   Medication Sig Start Date End Date Taking? Authorizing Provider  ADVAIR DISKUS 500-50 MCG/DOSE AEPB Inhale 1 puff into the lungs 2 (two) times daily.  06/15/14   [provider]  albuterol (PROVENTIL HFA;VENTOLIN HFA) 108 (90 Base) MCG/ACT inhaler Inhale 1-2 puffs into the lungs every 4 (four) hours as needed for wheezing or shortness of breath.    [provider]  alendronate (FOSAMAX) 70 MG tablet Take 70 mg by mouth once a week. On Wednesdays 07/07/14   [provider]  anastrozole (ARIMIDEX) 1 MG tablet TAKE 1 TABLET BY MOUTH EVERY DAY. MUST MAKE APPOINTMENT FIR FUTURE REFILLS 06/04/21   Magrinat, Virgie Dad, MD  atorvastatin (LIPITOR) 40 MG tablet Take 40 mg by mouth every morning.  05/12/14   [provider]  Calcium Carb-Cholecalciferol (CALCIUM 600 + D PO) Take 1 tablet by mouth daily.    [provider]  cholecalciferol (VITAMIN D) 1000 units tablet Take 1,000 Units by mouth daily.    [provider]  cyclobenzaprine (FLEXERIL) 5 MG tablet Take 1 tablet (5 mg total) by mouth 3 (three)  times daily as needed. 07/28/14   Carlota Raspberry, Tiffany, PA-C  FIBER SELECT GUMMIES PO Take 1 each by mouth daily.    [provider]  folic acid (FOLVITE) 1 MG tablet Take 1 mg by mouth daily.    [provider]  ibuprofen (MOTRIN IB) 200 MG tablet Take 3 tablets (600 mg total) by mouth every 8 (eight) hours as needed. 01/22/17   Azucena Fallen, MD  irbesartan (AVAPRO) 300 MG tablet Take 300 mg by mouth every morning.  05/30/14   [provider]  methotrexate (RHEUMATREX) 2.5 MG tablet Take 20 mg by mouth once a week. Takes on Thursdays Caution:Chemotherapy. Protect from light.    [provider]  Omega-3 1000 MG CAPS Take 1 capsule by mouth daily.    [provider]  Turmeric 500 MG CAPS Take 1 capsule by mouth daily.    [provider]      Allergies    Remicade [infliximab] and Zithromax [azithromycin]    Review of Systems   Review of Systems  Constitutional:  Negative for chills and fever.  Respiratory:  Negative for shortness of breath.   Cardiovascular:  Negative for chest pain.  Gastrointestinal:  Negative for abdominal pain.  Skin:  Positive for wound.  Neurological:  Negative for headaches.   Physical Exam Updated Vital Signs BP 133/66 (BP Location: Right Arm)   Pulse 67   Temp 98.5  F (36.9 C) (Oral)   Resp 18   Ht '5\' 4"'$  (1.626 m)   Wt 94.3 kg   SpO2 98%   BMI 35.70 kg/m  Physical Exam Vitals and nursing note reviewed. Exam conducted with a chaperone present.  Constitutional:      General: She is not in acute distress.    Appearance: She is not ill-appearing.  HENT:     Head: Normocephalic and atraumatic.     Nose: No congestion.     Mouth/Throat:     Mouth: Mucous membranes are moist.     Pharynx: Oropharynx is clear.     Comments: No trismus no torticollis tongue and uvula both midline controlling oral secretions tonsils equal symmetric bilaterally no splinting or swelling. Eyes:     Conjunctiva/sclera:  Conjunctivae normal.  Cardiovascular:     Rate and Rhythm: Normal rate and regular rhythm.     Pulses: Normal pulses.  Pulmonary:     Effort: Pulmonary effort is normal. No respiratory distress.     Breath sounds: Normal breath sounds. No wheezing or rales.  Chest:     Chest wall: No tenderness.  Skin:    General: Skin is warm and dry.     Comments: With chaperone present visualized patient's bug bite on the anterior aspect of her right thigh, is a small papule, without surrounding erythema or edema no drainage or discharge present, area slightly warm to the touch and tender but no fluctuance or induration present.  Limited skin exam performed but there is no noted rashes or hives on patient's abdomen back upper extremity or lower extremities.  Neurological:     Mental Status: She is alert.  Psychiatric:        Mood and Affect: Mood normal.    ED Results / Procedures / Treatments   Labs (all labs ordered are listed, but only abnormal results are displayed) Labs Reviewed - No data to display  EKG None  Radiology No results found.  Procedures Procedures    Medications Ordered in ED Medications - No data to display  ED Course/ Medical Decision Making/ A&P                           Medical Decision Making  This patient presents to the ED for concern of concerns of a bug bite, this involves an extensive number of treatment options, and is a complaint that carries with it a high risk of complications and morbidity.  The differential diagnosis includes anaphylaxis, cellulitis, abscess    Additional history obtained:  Additional history obtained from daughter at bedside External records from outside source obtained and reviewed including medical record, medication list,   Co morbidities that complicate the patient evaluation  Diabetes  Social Determinants of Health:  Geriatric    Lab Tests:  I Ordered, and personally interpreted labs.  The pertinent results  include: N/A   Imaging Studies ordered:  I ordered imaging studies including N/A I independently visualized and interpreted imaging which showed N/A I agree with the radiologist interpretation   Cardiac Monitoring:  The patient was maintained on a cardiac monitor.  I personally viewed and interpreted the cardiac monitored which showed an underlying rhythm of: N/A   Medicines ordered and prescription drug management:  I ordered medication including N/A I have reviewed the patients home medicines and have made adjustments as needed  Critical Interventions:  N/A   Reevaluation:  Presents with a bug bite, on  my exam there is a small papule noted on the anterior aspect of her right thigh, no evidence of infection, recommend basic wound care, and discharged with her agreement this plan.  Consultations Obtained:  N/a    Test Considered:  Considered giving Tdap patient up-to-date on her tetanus shot.    Rule out Low suspicion for anaphylaxis no systemic rash, no tongue throat lip swelling no GI symptoms.  Vital signs reassuring.  Low suspicion for cellulitis and/or abscess as there is no evidence of infection present my exam no fluctuant areas noted.    Dispostion and problem list  After consideration of the diagnostic results and the patients response to treatment, I feel that the patent would benefit from discharge.  Bug bite-recommend symptom management, follow-up PCP as needed strict return precautions.            Final Clinical Impression(s) / ED Diagnoses Final diagnoses:  Insect bite of right thigh, initial encounter    Rx / DC Orders ED Discharge Orders     None         Marcello Fennel, PA-C 12/01/21 2240    Milton Ferguson, MD 12/03/21 [phone]

## 2021-12-01 NOTE — ED Triage Notes (Signed)
Pt c/o bug bite to her right thigh that happened today. Pt has small red area to right thigh. No swelling, drainage noted. Pt states she thinks it was an ant or bee. Pt denies bee allergy. Pt denies difficulty breathing, swallowing.

## 2021-12-15 DIAGNOSIS — R051 Acute cough: Secondary | ICD-10-CM | POA: Diagnosis not present

## 2021-12-15 DIAGNOSIS — Z1152 Encounter for screening for COVID-19: Secondary | ICD-10-CM | POA: Diagnosis not present

## 2021-12-15 DIAGNOSIS — J3489 Other specified disorders of nose and nasal sinuses: Secondary | ICD-10-CM | POA: Diagnosis not present

## 2021-12-15 DIAGNOSIS — J069 Acute upper respiratory infection, unspecified: Secondary | ICD-10-CM | POA: Diagnosis not present

## 2021-12-15 DIAGNOSIS — M069 Rheumatoid arthritis, unspecified: Secondary | ICD-10-CM | POA: Diagnosis not present

## 2021-12-15 DIAGNOSIS — J45909 Unspecified asthma, uncomplicated: Secondary | ICD-10-CM | POA: Diagnosis not present

## 2021-12-15 DIAGNOSIS — R6883 Chills (without fever): Secondary | ICD-10-CM | POA: Diagnosis not present

## 2021-12-15 DIAGNOSIS — D869 Sarcoidosis, unspecified: Secondary | ICD-10-CM | POA: Diagnosis not present

## 2021-12-24 DIAGNOSIS — M0579 Rheumatoid arthritis with rheumatoid factor of multiple sites without organ or systems involvement: Secondary | ICD-10-CM | POA: Diagnosis not present

## 2021-12-24 DIAGNOSIS — E1169 Type 2 diabetes mellitus with other specified complication: Secondary | ICD-10-CM | POA: Diagnosis not present

## 2021-12-24 DIAGNOSIS — E785 Hyperlipidemia, unspecified: Secondary | ICD-10-CM | POA: Diagnosis not present

## 2021-12-24 DIAGNOSIS — Z79899 Other long term (current) drug therapy: Secondary | ICD-10-CM | POA: Diagnosis not present

## 2021-12-24 DIAGNOSIS — M810 Age-related osteoporosis without current pathological fracture: Secondary | ICD-10-CM | POA: Diagnosis not present

## 2021-12-24 DIAGNOSIS — D72819 Decreased white blood cell count, unspecified: Secondary | ICD-10-CM | POA: Diagnosis not present

## 2021-12-30 DIAGNOSIS — E1169 Type 2 diabetes mellitus with other specified complication: Secondary | ICD-10-CM | POA: Diagnosis not present

## 2021-12-30 DIAGNOSIS — J069 Acute upper respiratory infection, unspecified: Secondary | ICD-10-CM | POA: Diagnosis not present

## 2021-12-30 DIAGNOSIS — J45909 Unspecified asthma, uncomplicated: Secondary | ICD-10-CM | POA: Diagnosis not present

## 2021-12-30 DIAGNOSIS — M069 Rheumatoid arthritis, unspecified: Secondary | ICD-10-CM | POA: Diagnosis not present

## 2022-02-04 DIAGNOSIS — R739 Hyperglycemia, unspecified: Secondary | ICD-10-CM | POA: Diagnosis not present

## 2022-02-04 DIAGNOSIS — D509 Iron deficiency anemia, unspecified: Secondary | ICD-10-CM | POA: Diagnosis not present

## 2022-02-04 DIAGNOSIS — I10 Essential (primary) hypertension: Secondary | ICD-10-CM | POA: Diagnosis not present

## 2022-02-04 DIAGNOSIS — E785 Hyperlipidemia, unspecified: Secondary | ICD-10-CM | POA: Diagnosis not present

## 2022-02-04 DIAGNOSIS — R7989 Other specified abnormal findings of blood chemistry: Secondary | ICD-10-CM | POA: Diagnosis not present

## 2022-02-05 DIAGNOSIS — Z0000 Encounter for general adult medical examination without abnormal findings: Secondary | ICD-10-CM | POA: Diagnosis not present

## 2022-02-11 DIAGNOSIS — M069 Rheumatoid arthritis, unspecified: Secondary | ICD-10-CM | POA: Diagnosis not present

## 2022-02-11 DIAGNOSIS — E785 Hyperlipidemia, unspecified: Secondary | ICD-10-CM | POA: Diagnosis not present

## 2022-02-11 DIAGNOSIS — I10 Essential (primary) hypertension: Secondary | ICD-10-CM | POA: Diagnosis not present

## 2022-02-11 DIAGNOSIS — M818 Other osteoporosis without current pathological fracture: Secondary | ICD-10-CM | POA: Diagnosis not present

## 2022-02-11 DIAGNOSIS — E1169 Type 2 diabetes mellitus with other specified complication: Secondary | ICD-10-CM | POA: Diagnosis not present

## 2022-02-11 DIAGNOSIS — Z23 Encounter for immunization: Secondary | ICD-10-CM | POA: Diagnosis not present

## 2022-02-11 DIAGNOSIS — J302 Other seasonal allergic rhinitis: Secondary | ICD-10-CM | POA: Diagnosis not present

## 2022-02-11 DIAGNOSIS — J45909 Unspecified asthma, uncomplicated: Secondary | ICD-10-CM | POA: Diagnosis not present

## 2022-02-11 DIAGNOSIS — Z0000 Encounter for general adult medical examination without abnormal findings: Secondary | ICD-10-CM | POA: Diagnosis not present

## 2022-02-27 DIAGNOSIS — R82998 Other abnormal findings in urine: Secondary | ICD-10-CM | POA: Diagnosis not present

## 2022-03-26 DIAGNOSIS — E1169 Type 2 diabetes mellitus with other specified complication: Secondary | ICD-10-CM | POA: Diagnosis not present

## 2022-03-26 DIAGNOSIS — E785 Hyperlipidemia, unspecified: Secondary | ICD-10-CM | POA: Diagnosis not present

## 2022-03-26 DIAGNOSIS — Z79899 Other long term (current) drug therapy: Secondary | ICD-10-CM | POA: Diagnosis not present

## 2022-03-26 DIAGNOSIS — M810 Age-related osteoporosis without current pathological fracture: Secondary | ICD-10-CM | POA: Diagnosis not present

## 2022-03-26 DIAGNOSIS — M0579 Rheumatoid arthritis with rheumatoid factor of multiple sites without organ or systems involvement: Secondary | ICD-10-CM | POA: Diagnosis not present

## 2022-03-26 DIAGNOSIS — D72819 Decreased white blood cell count, unspecified: Secondary | ICD-10-CM | POA: Diagnosis not present

## 2022-05-27 DIAGNOSIS — E1169 Type 2 diabetes mellitus with other specified complication: Secondary | ICD-10-CM | POA: Diagnosis not present

## 2022-05-27 DIAGNOSIS — Z1152 Encounter for screening for COVID-19: Secondary | ICD-10-CM | POA: Diagnosis not present

## 2022-05-27 DIAGNOSIS — J302 Other seasonal allergic rhinitis: Secondary | ICD-10-CM | POA: Diagnosis not present

## 2022-05-27 DIAGNOSIS — R5383 Other fatigue: Secondary | ICD-10-CM | POA: Diagnosis not present

## 2022-05-27 DIAGNOSIS — M069 Rheumatoid arthritis, unspecified: Secondary | ICD-10-CM | POA: Diagnosis not present

## 2022-05-27 DIAGNOSIS — R051 Acute cough: Secondary | ICD-10-CM | POA: Diagnosis not present

## 2022-05-27 DIAGNOSIS — R0981 Nasal congestion: Secondary | ICD-10-CM | POA: Diagnosis not present

## 2022-05-27 DIAGNOSIS — D869 Sarcoidosis, unspecified: Secondary | ICD-10-CM | POA: Diagnosis not present

## 2022-06-19 DIAGNOSIS — J302 Other seasonal allergic rhinitis: Secondary | ICD-10-CM | POA: Diagnosis not present

## 2022-06-19 DIAGNOSIS — J45909 Unspecified asthma, uncomplicated: Secondary | ICD-10-CM | POA: Diagnosis not present

## 2022-06-19 DIAGNOSIS — E1169 Type 2 diabetes mellitus with other specified complication: Secondary | ICD-10-CM | POA: Diagnosis not present

## 2022-06-19 DIAGNOSIS — M069 Rheumatoid arthritis, unspecified: Secondary | ICD-10-CM | POA: Diagnosis not present

## 2022-06-19 DIAGNOSIS — I10 Essential (primary) hypertension: Secondary | ICD-10-CM | POA: Diagnosis not present

## 2022-06-19 DIAGNOSIS — J069 Acute upper respiratory infection, unspecified: Secondary | ICD-10-CM | POA: Diagnosis not present

## 2022-06-19 DIAGNOSIS — M818 Other osteoporosis without current pathological fracture: Secondary | ICD-10-CM | POA: Diagnosis not present

## 2022-06-19 DIAGNOSIS — K219 Gastro-esophageal reflux disease without esophagitis: Secondary | ICD-10-CM | POA: Diagnosis not present

## 2022-06-19 DIAGNOSIS — E785 Hyperlipidemia, unspecified: Secondary | ICD-10-CM | POA: Diagnosis not present

## 2022-06-25 DIAGNOSIS — E785 Hyperlipidemia, unspecified: Secondary | ICD-10-CM | POA: Diagnosis not present

## 2022-06-25 DIAGNOSIS — E1169 Type 2 diabetes mellitus with other specified complication: Secondary | ICD-10-CM | POA: Diagnosis not present

## 2022-06-25 DIAGNOSIS — M0579 Rheumatoid arthritis with rheumatoid factor of multiple sites without organ or systems involvement: Secondary | ICD-10-CM | POA: Diagnosis not present

## 2022-06-25 DIAGNOSIS — Z79899 Other long term (current) drug therapy: Secondary | ICD-10-CM | POA: Diagnosis not present

## 2022-06-25 DIAGNOSIS — M810 Age-related osteoporosis without current pathological fracture: Secondary | ICD-10-CM | POA: Diagnosis not present

## 2022-06-25 DIAGNOSIS — D72819 Decreased white blood cell count, unspecified: Secondary | ICD-10-CM | POA: Diagnosis not present

## 2022-07-09 DIAGNOSIS — Z961 Presence of intraocular lens: Secondary | ICD-10-CM | POA: Diagnosis not present

## 2022-07-09 DIAGNOSIS — H2512 Age-related nuclear cataract, left eye: Secondary | ICD-10-CM | POA: Diagnosis not present

## 2022-07-09 DIAGNOSIS — H25012 Cortical age-related cataract, left eye: Secondary | ICD-10-CM | POA: Diagnosis not present

## 2022-07-09 DIAGNOSIS — H26491 Other secondary cataract, right eye: Secondary | ICD-10-CM | POA: Diagnosis not present

## 2022-07-14 DIAGNOSIS — J45909 Unspecified asthma, uncomplicated: Secondary | ICD-10-CM | POA: Diagnosis not present

## 2022-07-14 DIAGNOSIS — J302 Other seasonal allergic rhinitis: Secondary | ICD-10-CM | POA: Diagnosis not present

## 2022-07-14 DIAGNOSIS — J069 Acute upper respiratory infection, unspecified: Secondary | ICD-10-CM | POA: Diagnosis not present

## 2022-07-14 DIAGNOSIS — I10 Essential (primary) hypertension: Secondary | ICD-10-CM | POA: Diagnosis not present

## 2022-07-14 DIAGNOSIS — E1169 Type 2 diabetes mellitus with other specified complication: Secondary | ICD-10-CM | POA: Diagnosis not present

## 2022-08-24 DIAGNOSIS — E1169 Type 2 diabetes mellitus with other specified complication: Secondary | ICD-10-CM | POA: Diagnosis not present

## 2022-08-24 DIAGNOSIS — J45909 Unspecified asthma, uncomplicated: Secondary | ICD-10-CM | POA: Diagnosis not present

## 2022-08-24 DIAGNOSIS — J302 Other seasonal allergic rhinitis: Secondary | ICD-10-CM | POA: Diagnosis not present

## 2022-08-24 DIAGNOSIS — I10 Essential (primary) hypertension: Secondary | ICD-10-CM | POA: Diagnosis not present

## 2022-08-24 DIAGNOSIS — H9193 Unspecified hearing loss, bilateral: Secondary | ICD-10-CM | POA: Diagnosis not present

## 2022-09-24 DIAGNOSIS — Z79899 Other long term (current) drug therapy: Secondary | ICD-10-CM | POA: Diagnosis not present

## 2022-09-24 DIAGNOSIS — D72819 Decreased white blood cell count, unspecified: Secondary | ICD-10-CM | POA: Diagnosis not present

## 2022-09-24 DIAGNOSIS — E1169 Type 2 diabetes mellitus with other specified complication: Secondary | ICD-10-CM | POA: Diagnosis not present

## 2022-09-24 DIAGNOSIS — M810 Age-related osteoporosis without current pathological fracture: Secondary | ICD-10-CM | POA: Diagnosis not present

## 2022-09-24 DIAGNOSIS — E785 Hyperlipidemia, unspecified: Secondary | ICD-10-CM | POA: Diagnosis not present

## 2022-09-24 DIAGNOSIS — M0579 Rheumatoid arthritis with rheumatoid factor of multiple sites without organ or systems involvement: Secondary | ICD-10-CM | POA: Diagnosis not present

## 2022-09-24 DIAGNOSIS — M25511 Pain in right shoulder: Secondary | ICD-10-CM | POA: Diagnosis not present

## 2022-09-24 DIAGNOSIS — M7580 Other shoulder lesions, unspecified shoulder: Secondary | ICD-10-CM | POA: Diagnosis not present

## 2022-10-08 DIAGNOSIS — Z01411 Encounter for gynecological examination (general) (routine) with abnormal findings: Secondary | ICD-10-CM | POA: Diagnosis not present

## 2022-10-08 DIAGNOSIS — D259 Leiomyoma of uterus, unspecified: Secondary | ICD-10-CM | POA: Diagnosis not present

## 2022-10-08 DIAGNOSIS — R87810 Cervical high risk human papillomavirus (HPV) DNA test positive: Secondary | ICD-10-CM | POA: Diagnosis not present

## 2022-10-08 DIAGNOSIS — Z124 Encounter for screening for malignant neoplasm of cervix: Secondary | ICD-10-CM | POA: Diagnosis not present

## 2022-10-08 DIAGNOSIS — Z01419 Encounter for gynecological examination (general) (routine) without abnormal findings: Secondary | ICD-10-CM | POA: Diagnosis not present

## 2022-10-08 DIAGNOSIS — R87610 Atypical squamous cells of undetermined significance on cytologic smear of cervix (ASC-US): Secondary | ICD-10-CM | POA: Diagnosis not present

## 2022-10-08 DIAGNOSIS — Z6834 Body mass index (BMI) 34.0-34.9, adult: Secondary | ICD-10-CM | POA: Diagnosis not present

## 2022-10-08 DIAGNOSIS — Z1231 Encounter for screening mammogram for malignant neoplasm of breast: Secondary | ICD-10-CM | POA: Diagnosis not present

## 2022-10-08 DIAGNOSIS — Z853 Personal history of malignant neoplasm of breast: Secondary | ICD-10-CM | POA: Diagnosis not present

## 2022-10-23 DIAGNOSIS — H9193 Unspecified hearing loss, bilateral: Secondary | ICD-10-CM | POA: Diagnosis not present

## 2022-10-23 DIAGNOSIS — J302 Other seasonal allergic rhinitis: Secondary | ICD-10-CM | POA: Diagnosis not present

## 2022-10-23 DIAGNOSIS — I10 Essential (primary) hypertension: Secondary | ICD-10-CM | POA: Diagnosis not present

## 2022-10-23 DIAGNOSIS — J45909 Unspecified asthma, uncomplicated: Secondary | ICD-10-CM | POA: Diagnosis not present

## 2022-10-23 DIAGNOSIS — M069 Rheumatoid arthritis, unspecified: Secondary | ICD-10-CM | POA: Diagnosis not present

## 2022-10-23 DIAGNOSIS — M818 Other osteoporosis without current pathological fracture: Secondary | ICD-10-CM | POA: Diagnosis not present

## 2022-10-23 DIAGNOSIS — E1169 Type 2 diabetes mellitus with other specified complication: Secondary | ICD-10-CM | POA: Diagnosis not present

## 2022-10-23 DIAGNOSIS — E785 Hyperlipidemia, unspecified: Secondary | ICD-10-CM | POA: Diagnosis not present

## 2022-12-15 DIAGNOSIS — E785 Hyperlipidemia, unspecified: Secondary | ICD-10-CM | POA: Diagnosis not present

## 2022-12-15 DIAGNOSIS — M25511 Pain in right shoulder: Secondary | ICD-10-CM | POA: Diagnosis not present

## 2022-12-15 DIAGNOSIS — M0579 Rheumatoid arthritis with rheumatoid factor of multiple sites without organ or systems involvement: Secondary | ICD-10-CM | POA: Diagnosis not present

## 2022-12-15 DIAGNOSIS — E1169 Type 2 diabetes mellitus with other specified complication: Secondary | ICD-10-CM | POA: Diagnosis not present

## 2022-12-15 DIAGNOSIS — Z79899 Other long term (current) drug therapy: Secondary | ICD-10-CM | POA: Diagnosis not present

## 2022-12-15 DIAGNOSIS — M7580 Other shoulder lesions, unspecified shoulder: Secondary | ICD-10-CM | POA: Diagnosis not present

## 2022-12-15 DIAGNOSIS — D72819 Decreased white blood cell count, unspecified: Secondary | ICD-10-CM | POA: Diagnosis not present

## 2022-12-15 DIAGNOSIS — M810 Age-related osteoporosis without current pathological fracture: Secondary | ICD-10-CM | POA: Diagnosis not present

## 2023-01-07 DIAGNOSIS — H903 Sensorineural hearing loss, bilateral: Secondary | ICD-10-CM | POA: Diagnosis not present

## 2023-02-26 DIAGNOSIS — R051 Acute cough: Secondary | ICD-10-CM | POA: Diagnosis not present

## 2023-02-26 DIAGNOSIS — Z1152 Encounter for screening for COVID-19: Secondary | ICD-10-CM | POA: Diagnosis not present

## 2023-02-26 DIAGNOSIS — R0981 Nasal congestion: Secondary | ICD-10-CM | POA: Diagnosis not present

## 2023-02-26 DIAGNOSIS — R52 Pain, unspecified: Secondary | ICD-10-CM | POA: Diagnosis not present

## 2023-02-26 DIAGNOSIS — J8410 Pulmonary fibrosis, unspecified: Secondary | ICD-10-CM | POA: Diagnosis not present

## 2023-02-26 DIAGNOSIS — J45901 Unspecified asthma with (acute) exacerbation: Secondary | ICD-10-CM | POA: Diagnosis not present

## 2023-02-26 DIAGNOSIS — U071 COVID-19: Secondary | ICD-10-CM | POA: Diagnosis not present

## 2023-02-26 DIAGNOSIS — R5383 Other fatigue: Secondary | ICD-10-CM | POA: Diagnosis not present

## 2023-03-17 DIAGNOSIS — Z1212 Encounter for screening for malignant neoplasm of rectum: Secondary | ICD-10-CM | POA: Diagnosis not present

## 2023-03-17 DIAGNOSIS — D509 Iron deficiency anemia, unspecified: Secondary | ICD-10-CM | POA: Diagnosis not present

## 2023-03-17 DIAGNOSIS — E611 Iron deficiency: Secondary | ICD-10-CM | POA: Diagnosis not present

## 2023-03-17 DIAGNOSIS — I10 Essential (primary) hypertension: Secondary | ICD-10-CM | POA: Diagnosis not present

## 2023-03-17 DIAGNOSIS — M818 Other osteoporosis without current pathological fracture: Secondary | ICD-10-CM | POA: Diagnosis not present

## 2023-03-17 DIAGNOSIS — E785 Hyperlipidemia, unspecified: Secondary | ICD-10-CM | POA: Diagnosis not present

## 2023-03-17 DIAGNOSIS — E1169 Type 2 diabetes mellitus with other specified complication: Secondary | ICD-10-CM | POA: Diagnosis not present

## 2023-03-19 DIAGNOSIS — M0579 Rheumatoid arthritis with rheumatoid factor of multiple sites without organ or systems involvement: Secondary | ICD-10-CM | POA: Diagnosis not present

## 2023-03-19 DIAGNOSIS — E1169 Type 2 diabetes mellitus with other specified complication: Secondary | ICD-10-CM | POA: Diagnosis not present

## 2023-03-19 DIAGNOSIS — E785 Hyperlipidemia, unspecified: Secondary | ICD-10-CM | POA: Diagnosis not present

## 2023-03-19 DIAGNOSIS — Z79899 Other long term (current) drug therapy: Secondary | ICD-10-CM | POA: Diagnosis not present

## 2023-03-19 DIAGNOSIS — M25511 Pain in right shoulder: Secondary | ICD-10-CM | POA: Diagnosis not present

## 2023-03-19 DIAGNOSIS — M810 Age-related osteoporosis without current pathological fracture: Secondary | ICD-10-CM | POA: Diagnosis not present

## 2023-03-19 DIAGNOSIS — D72819 Decreased white blood cell count, unspecified: Secondary | ICD-10-CM | POA: Diagnosis not present

## 2023-03-19 DIAGNOSIS — M7580 Other shoulder lesions, unspecified shoulder: Secondary | ICD-10-CM | POA: Diagnosis not present

## 2023-03-23 DIAGNOSIS — E611 Iron deficiency: Secondary | ICD-10-CM | POA: Diagnosis not present

## 2023-03-23 DIAGNOSIS — Z1212 Encounter for screening for malignant neoplasm of rectum: Secondary | ICD-10-CM | POA: Diagnosis not present

## 2023-03-24 DIAGNOSIS — M069 Rheumatoid arthritis, unspecified: Secondary | ICD-10-CM | POA: Diagnosis not present

## 2023-03-24 DIAGNOSIS — E785 Hyperlipidemia, unspecified: Secondary | ICD-10-CM | POA: Diagnosis not present

## 2023-03-24 DIAGNOSIS — K219 Gastro-esophageal reflux disease without esophagitis: Secondary | ICD-10-CM | POA: Diagnosis not present

## 2023-03-24 DIAGNOSIS — I10 Essential (primary) hypertension: Secondary | ICD-10-CM | POA: Diagnosis not present

## 2023-03-24 DIAGNOSIS — Z1339 Encounter for screening examination for other mental health and behavioral disorders: Secondary | ICD-10-CM | POA: Diagnosis not present

## 2023-03-24 DIAGNOSIS — Z0000 Encounter for general adult medical examination without abnormal findings: Secondary | ICD-10-CM | POA: Diagnosis not present

## 2023-03-24 DIAGNOSIS — R82998 Other abnormal findings in urine: Secondary | ICD-10-CM | POA: Diagnosis not present

## 2023-03-24 DIAGNOSIS — H9193 Unspecified hearing loss, bilateral: Secondary | ICD-10-CM | POA: Diagnosis not present

## 2023-03-24 DIAGNOSIS — M818 Other osteoporosis without current pathological fracture: Secondary | ICD-10-CM | POA: Diagnosis not present

## 2023-03-24 DIAGNOSIS — Z1331 Encounter for screening for depression: Secondary | ICD-10-CM | POA: Diagnosis not present

## 2023-03-24 DIAGNOSIS — E1169 Type 2 diabetes mellitus with other specified complication: Secondary | ICD-10-CM | POA: Diagnosis not present

## 2023-03-29 DIAGNOSIS — H903 Sensorineural hearing loss, bilateral: Secondary | ICD-10-CM | POA: Diagnosis not present

## 2023-06-22 DIAGNOSIS — D72819 Decreased white blood cell count, unspecified: Secondary | ICD-10-CM | POA: Diagnosis not present

## 2023-06-22 DIAGNOSIS — M0579 Rheumatoid arthritis with rheumatoid factor of multiple sites without organ or systems involvement: Secondary | ICD-10-CM | POA: Diagnosis not present

## 2023-06-22 DIAGNOSIS — Z79899 Other long term (current) drug therapy: Secondary | ICD-10-CM | POA: Diagnosis not present

## 2023-06-22 DIAGNOSIS — E1169 Type 2 diabetes mellitus with other specified complication: Secondary | ICD-10-CM | POA: Diagnosis not present

## 2023-06-22 DIAGNOSIS — M810 Age-related osteoporosis without current pathological fracture: Secondary | ICD-10-CM | POA: Diagnosis not present

## 2023-06-22 DIAGNOSIS — E785 Hyperlipidemia, unspecified: Secondary | ICD-10-CM | POA: Diagnosis not present

## 2023-07-13 DIAGNOSIS — R058 Other specified cough: Secondary | ICD-10-CM | POA: Diagnosis not present

## 2023-07-13 DIAGNOSIS — R0981 Nasal congestion: Secondary | ICD-10-CM | POA: Diagnosis not present

## 2023-07-13 DIAGNOSIS — E1169 Type 2 diabetes mellitus with other specified complication: Secondary | ICD-10-CM | POA: Diagnosis not present

## 2023-07-13 DIAGNOSIS — J45909 Unspecified asthma, uncomplicated: Secondary | ICD-10-CM | POA: Diagnosis not present

## 2023-07-13 DIAGNOSIS — J849 Interstitial pulmonary disease, unspecified: Secondary | ICD-10-CM | POA: Diagnosis not present

## 2023-07-13 DIAGNOSIS — J069 Acute upper respiratory infection, unspecified: Secondary | ICD-10-CM | POA: Diagnosis not present

## 2023-07-13 DIAGNOSIS — J029 Acute pharyngitis, unspecified: Secondary | ICD-10-CM | POA: Diagnosis not present

## 2023-07-13 DIAGNOSIS — M069 Rheumatoid arthritis, unspecified: Secondary | ICD-10-CM | POA: Diagnosis not present

## 2023-07-13 DIAGNOSIS — Z1152 Encounter for screening for COVID-19: Secondary | ICD-10-CM | POA: Diagnosis not present

## 2023-07-29 DIAGNOSIS — M818 Other osteoporosis without current pathological fracture: Secondary | ICD-10-CM | POA: Diagnosis not present

## 2023-07-29 DIAGNOSIS — E785 Hyperlipidemia, unspecified: Secondary | ICD-10-CM | POA: Diagnosis not present

## 2023-07-29 DIAGNOSIS — E1169 Type 2 diabetes mellitus with other specified complication: Secondary | ICD-10-CM | POA: Diagnosis not present

## 2023-07-29 DIAGNOSIS — J302 Other seasonal allergic rhinitis: Secondary | ICD-10-CM | POA: Diagnosis not present

## 2023-07-29 DIAGNOSIS — J45909 Unspecified asthma, uncomplicated: Secondary | ICD-10-CM | POA: Diagnosis not present

## 2023-07-29 DIAGNOSIS — H9193 Unspecified hearing loss, bilateral: Secondary | ICD-10-CM | POA: Diagnosis not present

## 2023-07-29 DIAGNOSIS — D509 Iron deficiency anemia, unspecified: Secondary | ICD-10-CM | POA: Diagnosis not present

## 2023-07-29 DIAGNOSIS — M069 Rheumatoid arthritis, unspecified: Secondary | ICD-10-CM | POA: Diagnosis not present

## 2023-07-29 DIAGNOSIS — I10 Essential (primary) hypertension: Secondary | ICD-10-CM | POA: Diagnosis not present

## 2023-08-19 DIAGNOSIS — E119 Type 2 diabetes mellitus without complications: Secondary | ICD-10-CM | POA: Diagnosis not present

## 2023-08-19 DIAGNOSIS — Z961 Presence of intraocular lens: Secondary | ICD-10-CM | POA: Diagnosis not present

## 2023-08-19 DIAGNOSIS — H5212 Myopia, left eye: Secondary | ICD-10-CM | POA: Diagnosis not present

## 2023-08-19 DIAGNOSIS — H2512 Age-related nuclear cataract, left eye: Secondary | ICD-10-CM | POA: Diagnosis not present

## 2023-08-19 DIAGNOSIS — H26491 Other secondary cataract, right eye: Secondary | ICD-10-CM | POA: Diagnosis not present

## 2023-08-19 DIAGNOSIS — H25012 Cortical age-related cataract, left eye: Secondary | ICD-10-CM | POA: Diagnosis not present

## 2023-08-20 LAB — LIPID PANEL
Cholesterol: 118
HDL: 56
LDL: 48
Triglycerides: 61 (ref 40–160)

## 2023-08-20 LAB — HEMOGLOBIN A1C: Hemoglobin A1C: 8.5

## 2023-08-20 NOTE — Progress Notes (Signed)
 Patient attended Heart Event. Patient has a PCP - Blood Pressure 135/70, A1C 8.5 and total Cholesterol 118,  HDL 56, LDL 48 and Triglycerides 61. Patient did not have any SDOH needs

## 2023-09-21 DIAGNOSIS — Z79899 Other long term (current) drug therapy: Secondary | ICD-10-CM | POA: Diagnosis not present

## 2023-09-21 DIAGNOSIS — D72819 Decreased white blood cell count, unspecified: Secondary | ICD-10-CM | POA: Diagnosis not present

## 2023-09-21 DIAGNOSIS — E1169 Type 2 diabetes mellitus with other specified complication: Secondary | ICD-10-CM | POA: Diagnosis not present

## 2023-09-21 DIAGNOSIS — M0579 Rheumatoid arthritis with rheumatoid factor of multiple sites without organ or systems involvement: Secondary | ICD-10-CM | POA: Diagnosis not present

## 2023-09-21 DIAGNOSIS — E785 Hyperlipidemia, unspecified: Secondary | ICD-10-CM | POA: Diagnosis not present

## 2023-09-21 DIAGNOSIS — M810 Age-related osteoporosis without current pathological fracture: Secondary | ICD-10-CM | POA: Diagnosis not present

## 2023-10-06 NOTE — Progress Notes (Unsigned)
 The patient attended a screening event on 08/20/2023 where her BP screening results was 135/70, hemoglobinA1C 8.5 diabetic. At the event the pt noted [employer]/Med[employer]are insurance as coverage and does not smoke. Patient did not indicate any SDOH needs. Pt listed PCP as Kristin Greathouse, MD.   Per chart review pt last PCP office visit was on 07/13/2023 at [facility]. Pt last BP was 135/70 at the screening event on 08/20/2023. Post event initial f/u [facility] called pt PCP on 10/06/2023 that confirmed pt was last seen by PCP on 07/29/2023. Pt also has a future appt with PCP on 11/26/2023 for f/u at [facility]. No additional Health equity team support indicated at this time.

## 2023-11-26 DIAGNOSIS — M818 Other osteoporosis without current pathological fracture: Secondary | ICD-10-CM | POA: Diagnosis not present

## 2023-11-26 DIAGNOSIS — M069 Rheumatoid arthritis, unspecified: Secondary | ICD-10-CM | POA: Diagnosis not present

## 2023-11-26 DIAGNOSIS — K219 Gastro-esophageal reflux disease without esophagitis: Secondary | ICD-10-CM | POA: Diagnosis not present

## 2023-11-26 DIAGNOSIS — E1169 Type 2 diabetes mellitus with other specified complication: Secondary | ICD-10-CM | POA: Diagnosis not present

## 2023-11-26 DIAGNOSIS — J45909 Unspecified asthma, uncomplicated: Secondary | ICD-10-CM | POA: Diagnosis not present

## 2023-11-26 DIAGNOSIS — J302 Other seasonal allergic rhinitis: Secondary | ICD-10-CM | POA: Diagnosis not present

## 2023-11-26 DIAGNOSIS — I10 Essential (primary) hypertension: Secondary | ICD-10-CM | POA: Diagnosis not present

## 2023-11-26 DIAGNOSIS — E785 Hyperlipidemia, unspecified: Secondary | ICD-10-CM | POA: Diagnosis not present

## 2023-11-30 DIAGNOSIS — Z01419 Encounter for gynecological examination (general) (routine) without abnormal findings: Secondary | ICD-10-CM | POA: Diagnosis not present

## 2023-11-30 DIAGNOSIS — Z124 Encounter for screening for malignant neoplasm of cervix: Secondary | ICD-10-CM | POA: Diagnosis not present

## 2023-11-30 DIAGNOSIS — Z779 Other contact with and (suspected) exposures hazardous to health: Secondary | ICD-10-CM | POA: Diagnosis not present

## 2023-11-30 DIAGNOSIS — Z01411 Encounter for gynecological examination (general) (routine) with abnormal findings: Secondary | ICD-10-CM | POA: Diagnosis not present

## 2023-11-30 DIAGNOSIS — Z1331 Encounter for screening for depression: Secondary | ICD-10-CM | POA: Diagnosis not present

## 2023-11-30 DIAGNOSIS — Z1231 Encounter for screening mammogram for malignant neoplasm of breast: Secondary | ICD-10-CM | POA: Diagnosis not present

## 2023-12-22 DIAGNOSIS — M810 Age-related osteoporosis without current pathological fracture: Secondary | ICD-10-CM | POA: Diagnosis not present

## 2023-12-22 DIAGNOSIS — D72819 Decreased white blood cell count, unspecified: Secondary | ICD-10-CM | POA: Diagnosis not present

## 2023-12-22 DIAGNOSIS — E1169 Type 2 diabetes mellitus with other specified complication: Secondary | ICD-10-CM | POA: Diagnosis not present

## 2023-12-22 DIAGNOSIS — E785 Hyperlipidemia, unspecified: Secondary | ICD-10-CM | POA: Diagnosis not present

## 2023-12-22 DIAGNOSIS — Z79899 Other long term (current) drug therapy: Secondary | ICD-10-CM | POA: Diagnosis not present

## 2023-12-22 DIAGNOSIS — M0579 Rheumatoid arthritis with rheumatoid factor of multiple sites without organ or systems involvement: Secondary | ICD-10-CM | POA: Diagnosis not present

## 2024-03-23 DIAGNOSIS — D72819 Decreased white blood cell count, unspecified: Secondary | ICD-10-CM | POA: Diagnosis not present

## 2024-03-23 DIAGNOSIS — Z79899 Other long term (current) drug therapy: Secondary | ICD-10-CM | POA: Diagnosis not present

## 2024-03-23 DIAGNOSIS — E785 Hyperlipidemia, unspecified: Secondary | ICD-10-CM | POA: Diagnosis not present

## 2024-03-23 DIAGNOSIS — M810 Age-related osteoporosis without current pathological fracture: Secondary | ICD-10-CM | POA: Diagnosis not present

## 2024-03-23 DIAGNOSIS — M0579 Rheumatoid arthritis with rheumatoid factor of multiple sites without organ or systems involvement: Secondary | ICD-10-CM | POA: Diagnosis not present

## 2024-03-23 DIAGNOSIS — E1169 Type 2 diabetes mellitus with other specified complication: Secondary | ICD-10-CM | POA: Diagnosis not present

## 2024-04-14 DIAGNOSIS — M818 Other osteoporosis without current pathological fracture: Secondary | ICD-10-CM | POA: Diagnosis not present

## 2024-04-14 DIAGNOSIS — R82998 Other abnormal findings in urine: Secondary | ICD-10-CM | POA: Diagnosis not present

## 2024-04-14 DIAGNOSIS — Z1212 Encounter for screening for malignant neoplasm of rectum: Secondary | ICD-10-CM | POA: Diagnosis not present

## 2024-04-14 DIAGNOSIS — I10 Essential (primary) hypertension: Secondary | ICD-10-CM | POA: Diagnosis not present

## 2024-04-14 DIAGNOSIS — E1169 Type 2 diabetes mellitus with other specified complication: Secondary | ICD-10-CM | POA: Diagnosis not present

## 2024-04-14 DIAGNOSIS — D509 Iron deficiency anemia, unspecified: Secondary | ICD-10-CM | POA: Diagnosis not present

## 2024-04-14 DIAGNOSIS — E785 Hyperlipidemia, unspecified: Secondary | ICD-10-CM | POA: Diagnosis not present

## 2024-04-20 DIAGNOSIS — J849 Interstitial pulmonary disease, unspecified: Secondary | ICD-10-CM | POA: Diagnosis not present

## 2024-04-20 DIAGNOSIS — E785 Hyperlipidemia, unspecified: Secondary | ICD-10-CM | POA: Diagnosis not present

## 2024-04-20 DIAGNOSIS — N905 Atrophy of vulva: Secondary | ICD-10-CM | POA: Diagnosis not present

## 2024-04-20 DIAGNOSIS — D84821 Immunodeficiency due to drugs: Secondary | ICD-10-CM | POA: Diagnosis not present

## 2024-04-20 DIAGNOSIS — Z823 Family history of stroke: Secondary | ICD-10-CM | POA: Diagnosis not present

## 2024-04-20 DIAGNOSIS — Z7984 Long term (current) use of oral hypoglycemic drugs: Secondary | ICD-10-CM | POA: Diagnosis not present

## 2024-04-20 DIAGNOSIS — R32 Unspecified urinary incontinence: Secondary | ICD-10-CM | POA: Diagnosis not present

## 2024-04-20 DIAGNOSIS — Z809 Family history of malignant neoplasm, unspecified: Secondary | ICD-10-CM | POA: Diagnosis not present

## 2024-04-20 DIAGNOSIS — G3184 Mild cognitive impairment, so stated: Secondary | ICD-10-CM | POA: Diagnosis not present

## 2024-04-20 DIAGNOSIS — I10 Essential (primary) hypertension: Secondary | ICD-10-CM | POA: Diagnosis not present

## 2024-04-20 DIAGNOSIS — M1990 Unspecified osteoarthritis, unspecified site: Secondary | ICD-10-CM | POA: Diagnosis not present

## 2024-04-20 DIAGNOSIS — Z882 Allergy status to sulfonamides status: Secondary | ICD-10-CM | POA: Diagnosis not present

## 2024-04-20 DIAGNOSIS — D529 Folate deficiency anemia, unspecified: Secondary | ICD-10-CM | POA: Diagnosis not present

## 2024-04-20 DIAGNOSIS — M069 Rheumatoid arthritis, unspecified: Secondary | ICD-10-CM | POA: Diagnosis not present

## 2024-04-20 DIAGNOSIS — Z79631 Long term (current) use of antimetabolite agent: Secondary | ICD-10-CM | POA: Diagnosis not present

## 2024-04-20 DIAGNOSIS — E1165 Type 2 diabetes mellitus with hyperglycemia: Secondary | ICD-10-CM | POA: Diagnosis not present

## 2024-04-20 DIAGNOSIS — Z7983 Long term (current) use of bisphosphonates: Secondary | ICD-10-CM | POA: Diagnosis not present

## 2024-04-20 DIAGNOSIS — M810 Age-related osteoporosis without current pathological fracture: Secondary | ICD-10-CM | POA: Diagnosis not present

## 2024-04-20 DIAGNOSIS — I2510 Atherosclerotic heart disease of native coronary artery without angina pectoris: Secondary | ICD-10-CM | POA: Diagnosis not present

## 2024-04-20 DIAGNOSIS — J4489 Other specified chronic obstructive pulmonary disease: Secondary | ICD-10-CM | POA: Diagnosis not present

## 2024-04-21 DIAGNOSIS — M818 Other osteoporosis without current pathological fracture: Secondary | ICD-10-CM | POA: Diagnosis not present

## 2024-04-21 DIAGNOSIS — Z1339 Encounter for screening examination for other mental health and behavioral disorders: Secondary | ICD-10-CM | POA: Diagnosis not present

## 2024-04-21 DIAGNOSIS — E785 Hyperlipidemia, unspecified: Secondary | ICD-10-CM | POA: Diagnosis not present

## 2024-04-21 DIAGNOSIS — M069 Rheumatoid arthritis, unspecified: Secondary | ICD-10-CM | POA: Diagnosis not present

## 2024-04-21 DIAGNOSIS — Z0000 Encounter for general adult medical examination without abnormal findings: Secondary | ICD-10-CM | POA: Diagnosis not present

## 2024-04-21 DIAGNOSIS — E1169 Type 2 diabetes mellitus with other specified complication: Secondary | ICD-10-CM | POA: Diagnosis not present

## 2024-04-21 DIAGNOSIS — I10 Essential (primary) hypertension: Secondary | ICD-10-CM | POA: Diagnosis not present

## 2024-04-21 DIAGNOSIS — J302 Other seasonal allergic rhinitis: Secondary | ICD-10-CM | POA: Diagnosis not present

## 2024-04-21 DIAGNOSIS — Z23 Encounter for immunization: Secondary | ICD-10-CM | POA: Diagnosis not present

## 2024-04-21 DIAGNOSIS — Z1331 Encounter for screening for depression: Secondary | ICD-10-CM | POA: Diagnosis not present

## 2024-04-21 DIAGNOSIS — J45909 Unspecified asthma, uncomplicated: Secondary | ICD-10-CM | POA: Diagnosis not present

## 2024-04-21 DIAGNOSIS — D869 Sarcoidosis, unspecified: Secondary | ICD-10-CM | POA: Diagnosis not present

## 2024-04-26 DIAGNOSIS — U071 COVID-19: Secondary | ICD-10-CM | POA: Diagnosis not present

## 2024-04-26 DIAGNOSIS — J45909 Unspecified asthma, uncomplicated: Secondary | ICD-10-CM | POA: Diagnosis not present

## 2024-04-26 DIAGNOSIS — R058 Other specified cough: Secondary | ICD-10-CM | POA: Diagnosis not present

## 2024-04-26 DIAGNOSIS — J849 Interstitial pulmonary disease, unspecified: Secondary | ICD-10-CM | POA: Diagnosis not present

## 2024-04-26 DIAGNOSIS — E1169 Type 2 diabetes mellitus with other specified complication: Secondary | ICD-10-CM | POA: Diagnosis not present

## 2024-04-26 DIAGNOSIS — M069 Rheumatoid arthritis, unspecified: Secondary | ICD-10-CM | POA: Diagnosis not present

## 2024-04-26 DIAGNOSIS — J302 Other seasonal allergic rhinitis: Secondary | ICD-10-CM | POA: Diagnosis not present

## 2024-04-26 DIAGNOSIS — R0981 Nasal congestion: Secondary | ICD-10-CM | POA: Diagnosis not present

## 2024-04-26 DIAGNOSIS — R5383 Other fatigue: Secondary | ICD-10-CM | POA: Diagnosis not present
